# Patient Record
Sex: Male | Born: 1935 | Race: White
Health system: Northeastern US, Academic
[De-identification: ages and names within clinical notes are randomized; demographics above are authoritative.]

## PROBLEM LIST (undated history)

## (undated) DIAGNOSIS — I10 Essential (primary) hypertension: Secondary | ICD-10-CM

## (undated) DIAGNOSIS — E119 Type 2 diabetes mellitus without complications: Secondary | ICD-10-CM

## (undated) HISTORY — DX: Essential (primary) hypertension: I10

## (undated) HISTORY — DX: Type 2 diabetes mellitus without complications: E11.9

## (undated) MED FILL — Irinotecan HCl Inj 100 MG/5ML (20 MG/ML): INTRAVENOUS | Qty: 20.3 | Status: AC

---

## 2019-09-10 ENCOUNTER — Encounter: Payer: Self-pay | Admitting: Gastroenterology

## 2019-09-10 DIAGNOSIS — C187 Malignant neoplasm of sigmoid colon: Secondary | ICD-10-CM

## 2019-09-10 HISTORY — PX: TOTAL COLECTOMY: SHX852

## 2019-09-10 HISTORY — DX: Malignant neoplasm of sigmoid colon: C18.7

## 2019-09-12 ENCOUNTER — Encounter: Payer: Self-pay | Admitting: Gastroenterology

## 2019-10-12 ENCOUNTER — Encounter: Payer: Self-pay | Admitting: Gastroenterology

## 2019-10-19 ENCOUNTER — Encounter: Payer: Self-pay | Admitting: Gastroenterology

## 2019-11-16 ENCOUNTER — Other Ambulatory Visit: Payer: Self-pay | Admitting: Gastroenterology

## 2019-11-16 DIAGNOSIS — I2699 Other pulmonary embolism without acute cor pulmonale: Secondary | ICD-10-CM

## 2019-11-16 HISTORY — DX: Other pulmonary embolism without acute cor pulmonale: I26.99

## 2019-11-22 ENCOUNTER — Other Ambulatory Visit: Payer: Self-pay | Admitting: Gastroenterology

## 2019-11-22 IMAGING — PT PET CT SCAN TUMOR IMAGING SKULL TO THIGH  ([facility])
3 series · 25 of 25 positions shown · IV contrast (agent unspecified)
Comparison: CT abdomen pelvis September 10, 2019 outside facility

PET CT SCAN TUMOR IMAGING SKULL TO THIGH  ([facility]), CT CHEST/ABDOMEN AND PELVIS 
WITH CONTRAST  ([facility]), 11/22/2019 [DATE]: 
(Films were taken at [facility] on   11/22/2019  and interpreted 
at [facility] on 11/22/2019. 
CLINICAL INDICATION:  Malignant neoplasm sigmoid colon
TECHNIQUE: A dose of 14.3 millicuries of 18-FDG was administered intravenously 
and skull to thigh PET scanning was performed at 55 minutes. Tomographic scans 
were reconstructed in axial, coronal, and sagittal projections. The data was 
reconstructed into a three-dimensional volume rendered image and reviewed in a 
rotational cine loop. Serum blood glucose at the time of injection was 118 
mg/dl.

[Series 3: pet ac · axial · 4.0mm · 4.07mm/px · z∈[-878,-2]mm · 9 of 220 slices shown]
[im 1/220]
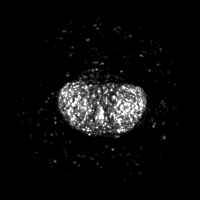
[im 28/220]
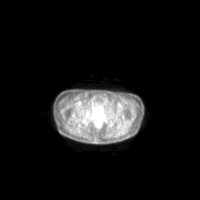
[im 55/220]
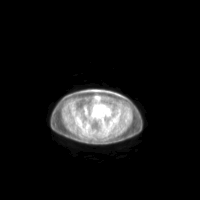
[im 83/220]
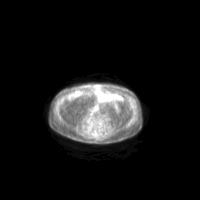
[im 110/220]
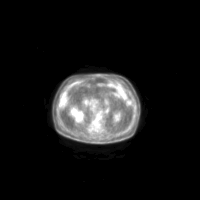
[im 137/220]
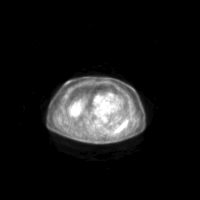
[im 165/220]
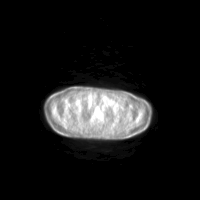
[im 192/220]
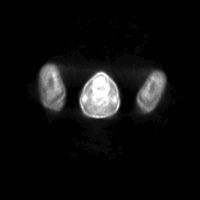
[im 220/220]
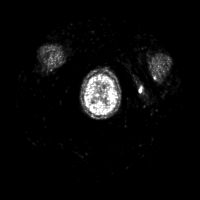

[Series 4: ct wb · axial · 4.0mm · 0.98mm/px · z∈[-878,-2]mm · 8 of 220 slices shown]
[im 1/220]
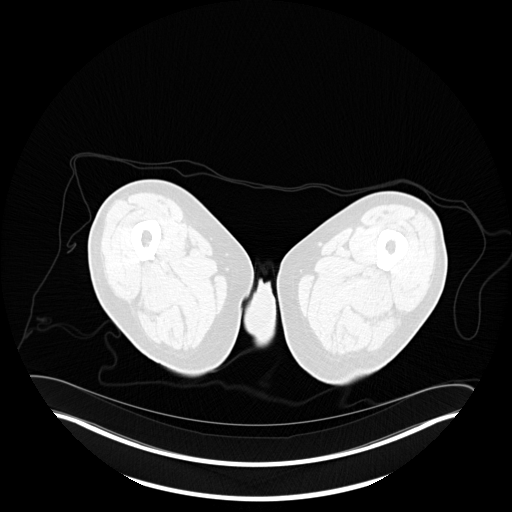
[im 32/220]
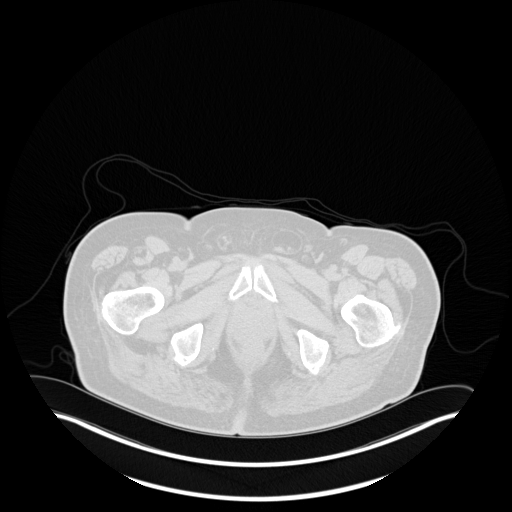
[im 63/220]
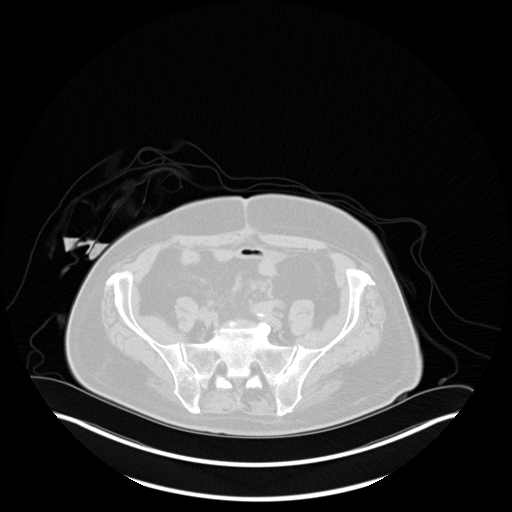
[im 94/220]
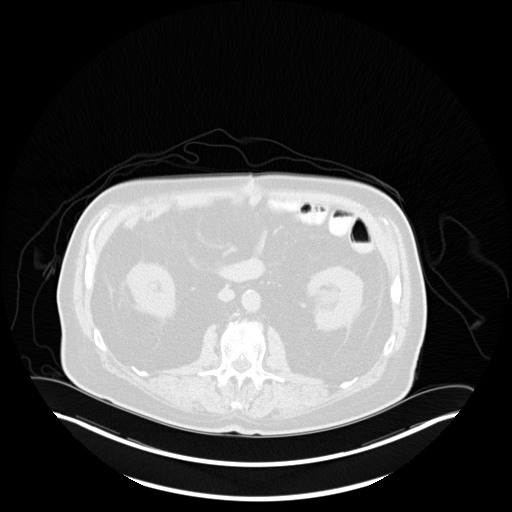
[im 126/220]
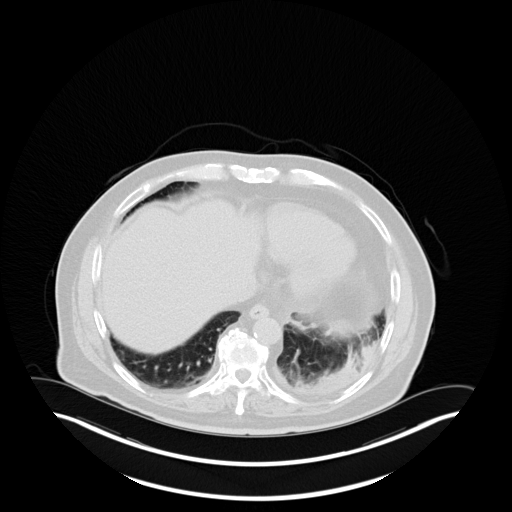
[im 157/220]
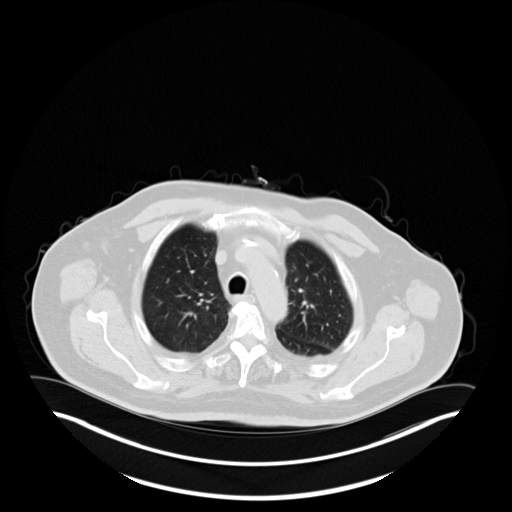
[im 188/220  brain]
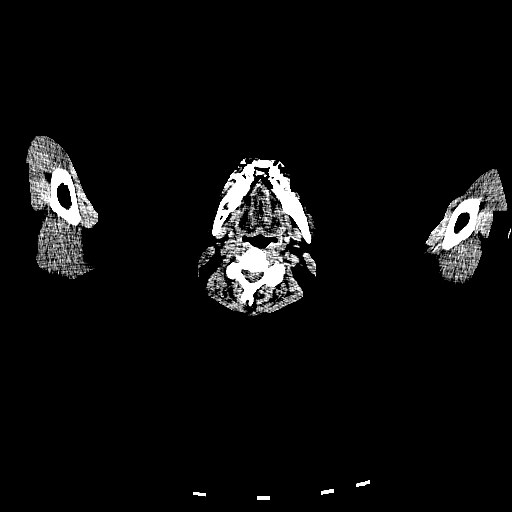
[im 220/220  brain]
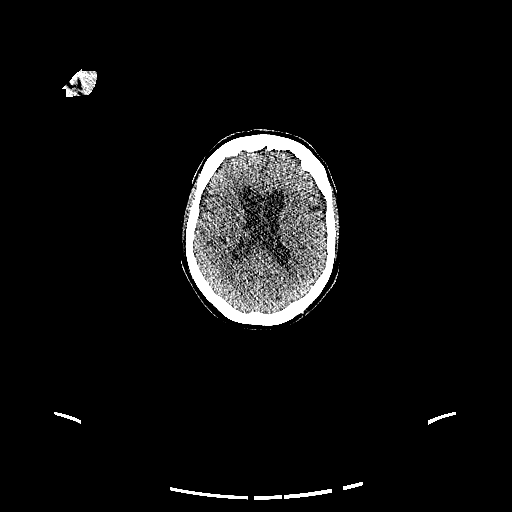

[Series 5: pet wb nac · axial · 4.0mm · 4.07mm/px · z∈[-878,-2]mm · 8 of 220 slices shown]
[im 1/220]
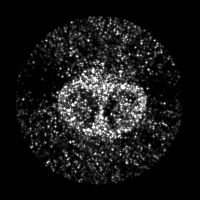
[im 32/220]
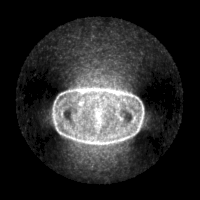
[im 63/220]
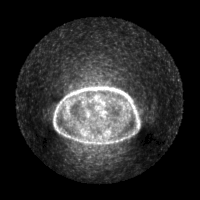
[im 94/220]
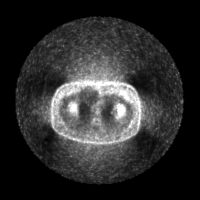
[im 126/220]
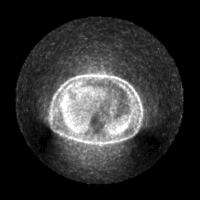
[im 157/220]
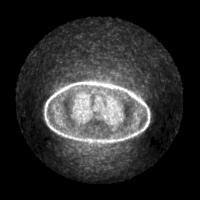
[im 188/220]
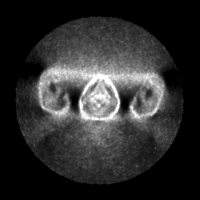
[im 220/220]
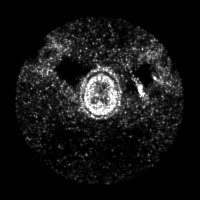

[25 of 25 positions shown; findings below may reference images not displayed]

FINDINGS: There is no abnormal FDG activity in cervical, supraclavicular or 
axillary node bearing regions. There is mild activity in the upper right hilum 
without evidence for adenopathy or mass. Maximum SUV 1.8. This is nonspecific 
but likely benign. Activity in the left hilum, maximum SUV 3.4 corresponding to 
CT image 76, also without evident adenopathy or mass, indeterminant. There is 
atelectasis at the left base showing maximum SUV 4.7, likely resolving 
postinflammatory change. No FDG avid intraperitoneal or retroperitoneal 
adenopathy. The liver and spleen are unremarkable. The patient is status post 
partial sigmoid resection with right-sided pelvic colostomy. No abnormal FDG 
activity corresponding to the colon or colostomy site. There is physiologic 
excretion of radiopharmaceutical by the urinary system. Prostate is enlarged but 
does not show FDG uptake. There is no abnormal osseous activity. 
CT of the chest shows no evidence for developing nodule or effusion. There are 
coronary artery calcifications. There is a left-sided chest port. The heart is 
not enlarged. 
CT of the abdomen and pelvis shows no adenopathy. There has been distal sigmoid 
resection and right colectomy. Colostomy site is unremarkable. Prostate is 
enlarged, mildly elevating the bladder. No visibly enlarged lymph nodes. Small 
lymph nodes in the midline pelvis are seen on axial images 136-146, the largest 
measuring 5 mm in short axis and not showing FDG activity. There is a benign 
right renal cyst. There are gallstones. No adrenal nodule. 
There is mild aortic plaque. There are degenerative changes. There is a 
fat-containing left inguinal hernia.
IMPRESSION: No evidence for recurrent or new neoplasm. Patient is status post right 
colostomy and sigmoid resection. 
Mild activity corresponding to atelectasis at the left lung base, most likely 
postinflammatory sequelae. 
Mild activity in the pulmonary hila as described, without evidence for mass or 
adenopathy. This is indeterminant but likely benign. Further surveillance 
follow-up advised. 
Cholelithiasis. 
Small fat-containing left inguinal hernia.

## 2019-11-22 IMAGING — CT CT CHEST/ABDOMEN AND PELVIS WITH CONTRAST  ([facility])
1 of 4 series · 12 of 32 positions shown, 18 images · IV contrast (agent unspecified)
Comparison: CT abdomen pelvis September 10, 2019 outside facility

PET CT SCAN TUMOR IMAGING SKULL TO THIGH  ([facility]), CT CHEST/ABDOMEN AND PELVIS 
WITH CONTRAST  ([facility]), 11/22/2019 [DATE]: 
(Films were taken at [facility] on   11/22/2019  and interpreted 
at [facility] on 11/22/2019. 
CLINICAL INDICATION:  Malignant neoplasm sigmoid colon
TECHNIQUE: A dose of 14.3 millicuries of 18-FDG was administered intravenously 
and skull to thigh PET scanning was performed at 55 minutes. Tomographic scans 
were reconstructed in axial, coronal, and sagittal projections. The data was 
reconstructed into a three-dimensional volume rendered image and reviewed in a 
rotational cine loop. Serum blood glucose at the time of injection was 118 
mg/dl.

[Series 2: cap w · axial · 0.98mm/px · z∈[-781,-220]mm · 12 of 221 slices shown, 18 images]
[im 17/221  soft-tissue]
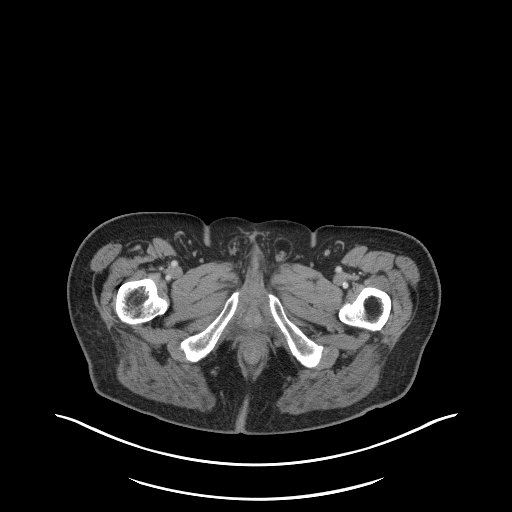
[im 17/221  bone]
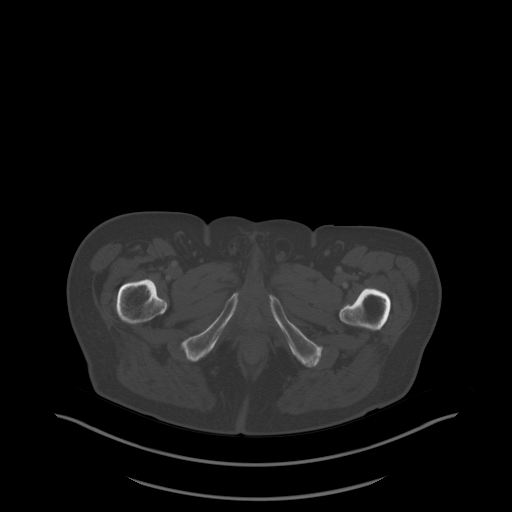
[im 34/221  soft-tissue]
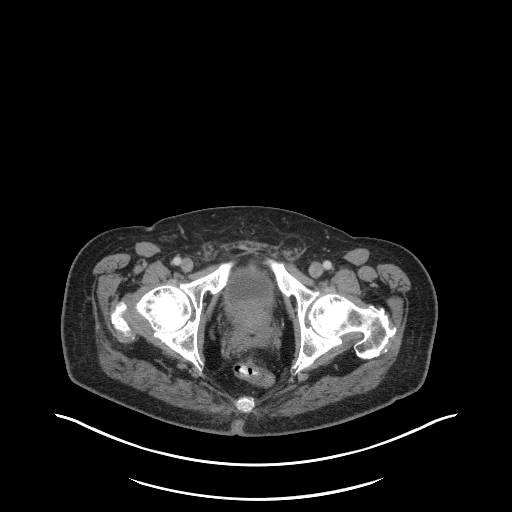
[im 51/221  soft-tissue]
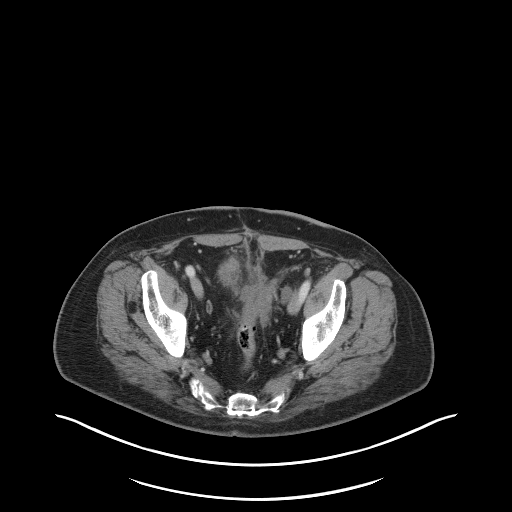
[im 68/221  soft-tissue]
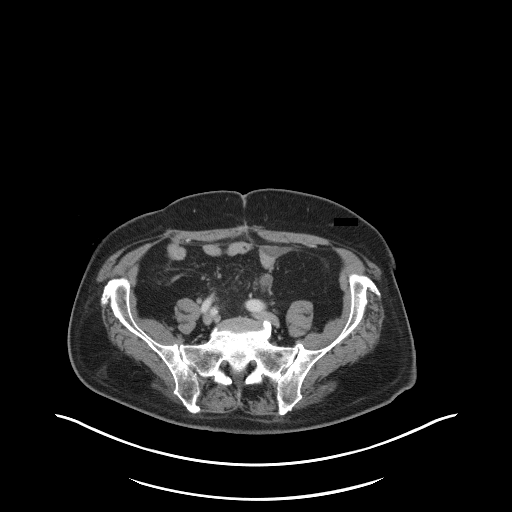
[im 85/221  soft-tissue]
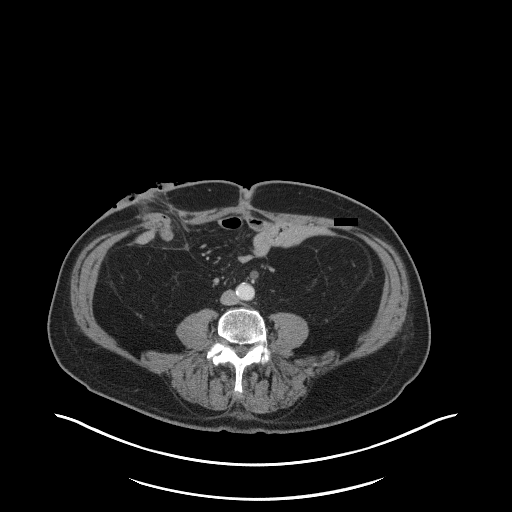
[im 102/221  soft-tissue]
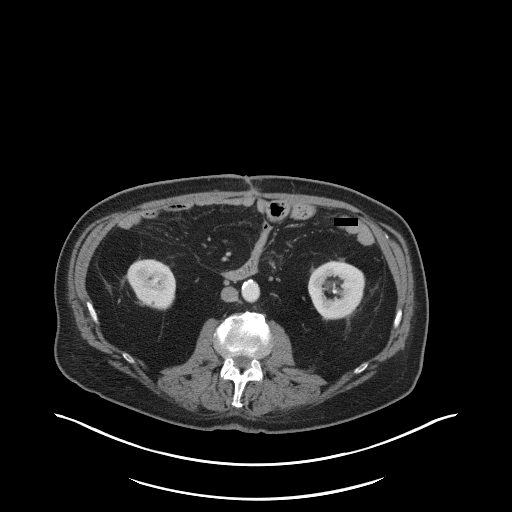
[im 119/221  soft-tissue]
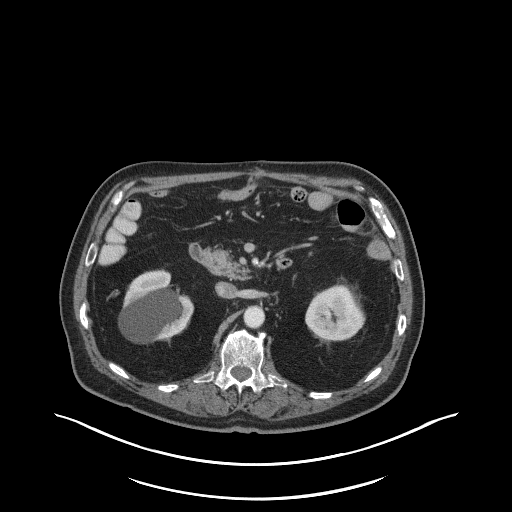
[im 136/221  soft-tissue]
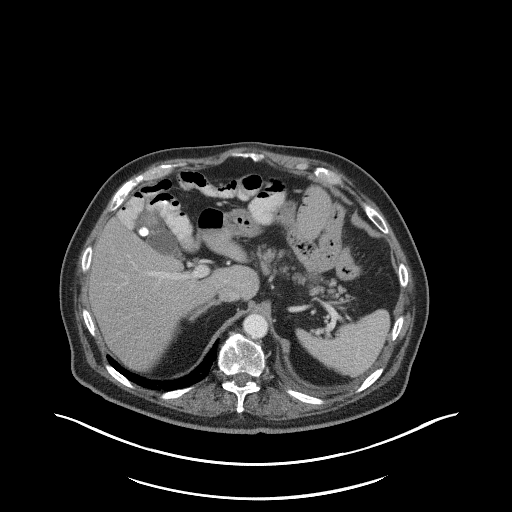
[im 153/221  soft-tissue]
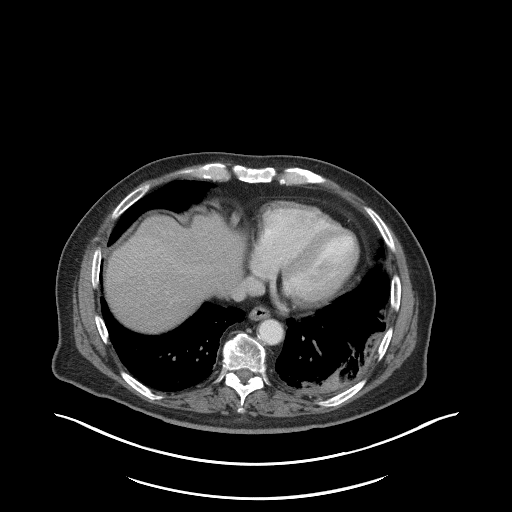
[im 153/221  lung]
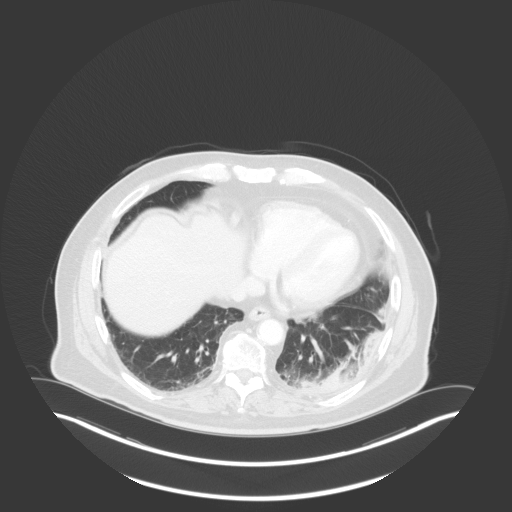
[im 153/221  bone]
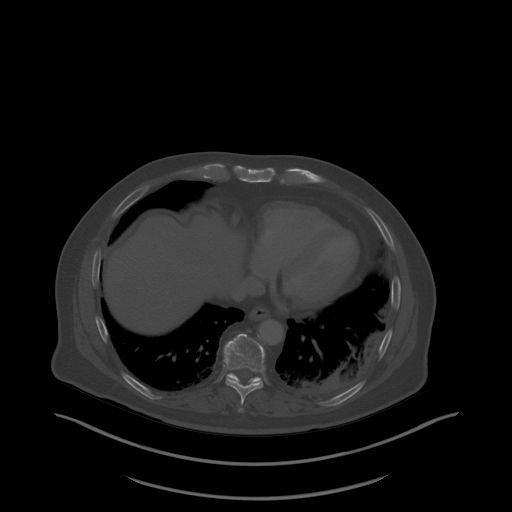
[im 170/221  soft-tissue]
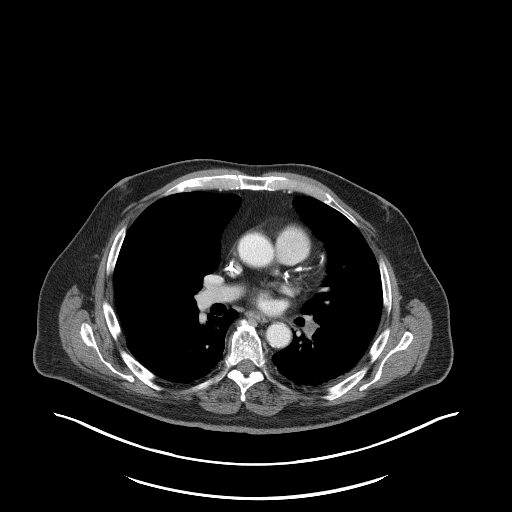
[im 170/221  lung]
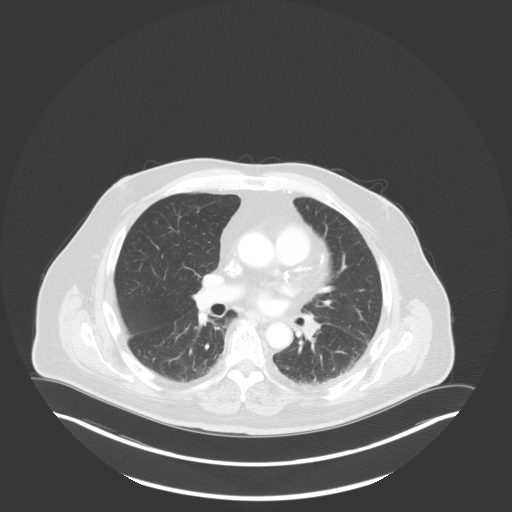
[im 187/221  soft-tissue]
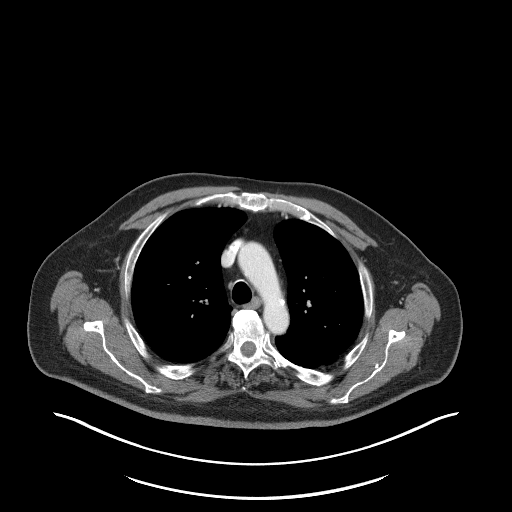
[im 187/221  lung]
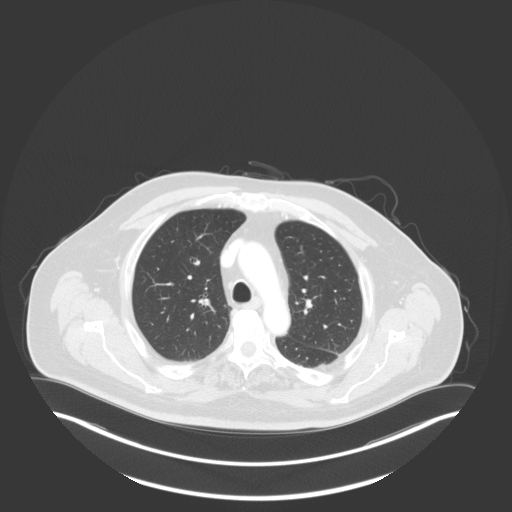
[im 204/221  soft-tissue]
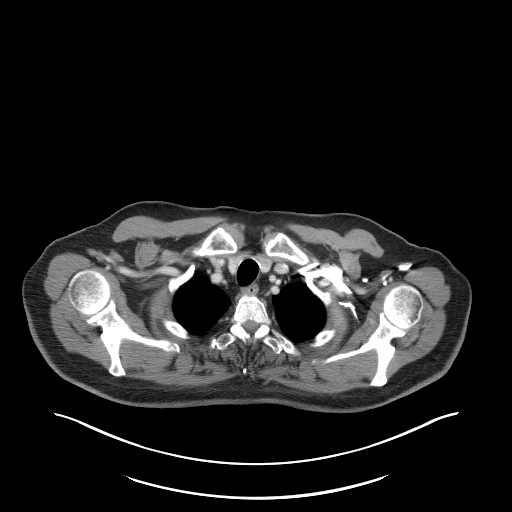
[im 204/221  lung]
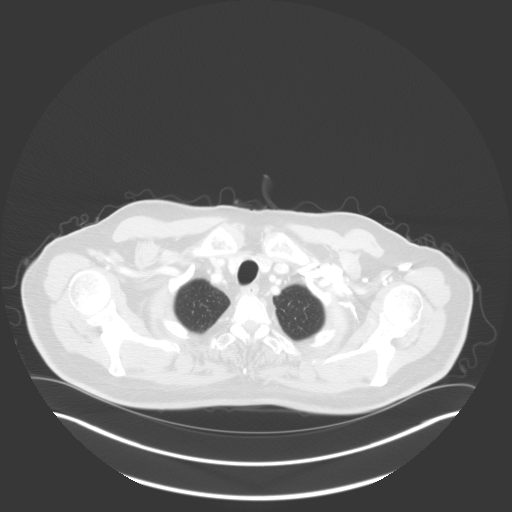

[12 of 32 positions shown; findings below may reference images not displayed]

FINDINGS: There is no abnormal FDG activity in cervical, supraclavicular or 
axillary node bearing regions. There is mild activity in the upper right hilum 
without evidence for adenopathy or mass. Maximum SUV 1.8. This is nonspecific 
but likely benign. Activity in the left hilum, maximum SUV 3.4 corresponding to 
CT image 76, also without evident adenopathy or mass, indeterminant. There is 
atelectasis at the left base showing maximum SUV 4.7, likely resolving 
postinflammatory change. No FDG avid intraperitoneal or retroperitoneal 
adenopathy. The liver and spleen are unremarkable. The patient is status post 
partial sigmoid resection with right-sided pelvic colostomy. No abnormal FDG 
activity corresponding to the colon or colostomy site. There is physiologic 
excretion of radiopharmaceutical by the urinary system. Prostate is enlarged but 
does not show FDG uptake. There is no abnormal osseous activity. 
CT of the chest shows no evidence for developing nodule or effusion. There are 
coronary artery calcifications. There is a left-sided chest port. The heart is 
not enlarged. 
CT of the abdomen and pelvis shows no adenopathy. There has been distal sigmoid 
resection and right colectomy. Colostomy site is unremarkable. Prostate is 
enlarged, mildly elevating the bladder. No visibly enlarged lymph nodes. Small 
lymph nodes in the midline pelvis are seen on axial images 136-146, the largest 
measuring 5 mm in short axis and not showing FDG activity. There is a benign 
right renal cyst. There are gallstones. No adrenal nodule. 
There is mild aortic plaque. There are degenerative changes. There is a 
fat-containing left inguinal hernia.
IMPRESSION: No evidence for recurrent or new neoplasm. Patient is status post right 
colostomy and sigmoid resection. 
Mild activity corresponding to atelectasis at the left lung base, most likely 
postinflammatory sequelae. 
Mild activity in the pulmonary hila as described, without evidence for mass or 
adenopathy. This is indeterminant but likely benign. Further surveillance 
follow-up advised. 
Cholelithiasis. 
Small fat-containing left inguinal hernia.

## 2019-12-19 ENCOUNTER — Encounter: Payer: Self-pay | Admitting: Gastroenterology

## 2020-01-09 ENCOUNTER — Encounter: Payer: Self-pay | Admitting: Gastroenterology

## 2020-01-17 ENCOUNTER — Encounter: Payer: Self-pay | Admitting: Gastroenterology

## 2020-02-02 ENCOUNTER — Other Ambulatory Visit: Payer: Self-pay | Admitting: Gastroenterology

## 2020-02-02 IMAGING — CT CT CHEST/ABDOMEN AND PELVIS WITH CONTRAST  ([facility])
1 of 4 series · 12 of 32 positions shown, 18 images · IV contrast (isovue)
Comparison: Comparison was made to the prior exam(s) within the last 12 months 
dated  12/22/2018 CT and PET/CT

CT CHEST/ABDOMEN AND PELVIS WITH CONTRAST  ([facility]), 02/02/2020 [DATE]: 
(Films were taken at [facility].) 
CLINICAL INDICATION:  Malignant neoplasm sigmoid colon. Patient is receiving 
chemotherapy. 
A search for DICOM formatted images was conducted for prior CT imaging studies 
completed at a non-affiliated media free facility.
TECHNIQUE: The chest, abdomen and pelvis were scanned with 100 ml of 
intravenous Isovue 300. Routine MPR reconstructions were performed.

[Series 2: cap w · axial · 0.98mm/px · z∈[-500,+130]mm · 12 of 246 slices shown, 18 images]
[im 18/246  soft-tissue]
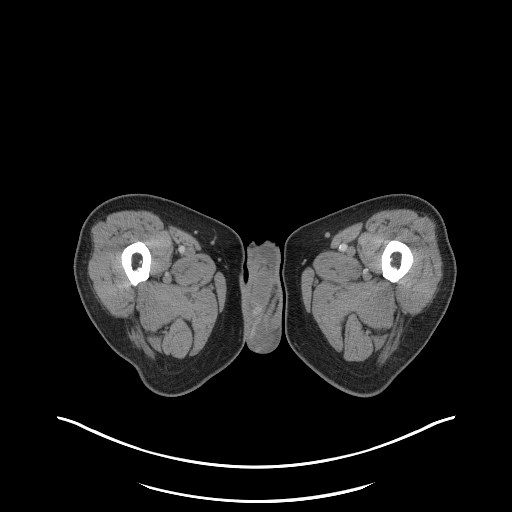
[im 18/246  bone]
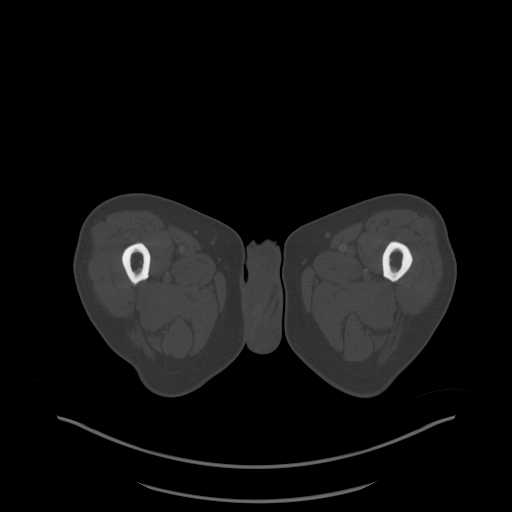
[im 36/246  soft-tissue]
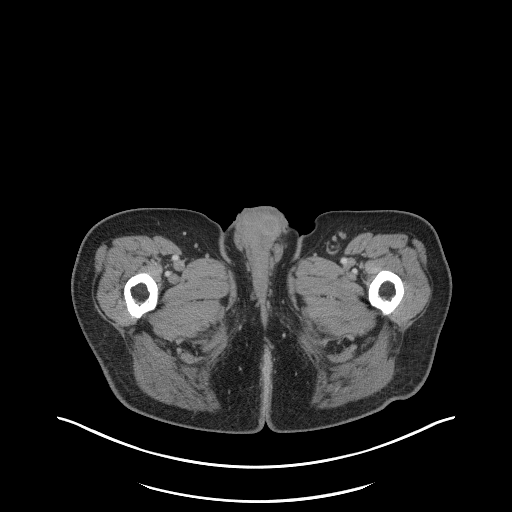
[im 53/246  soft-tissue]
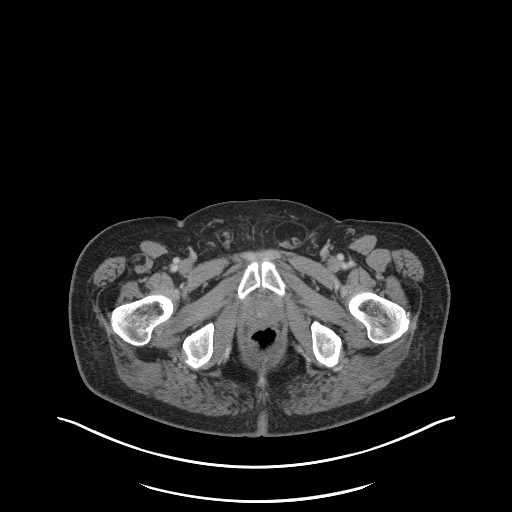
[im 71/246  soft-tissue]
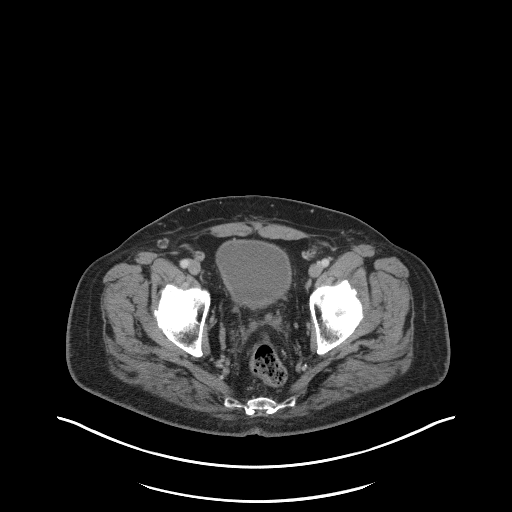
[im 88/246  soft-tissue]
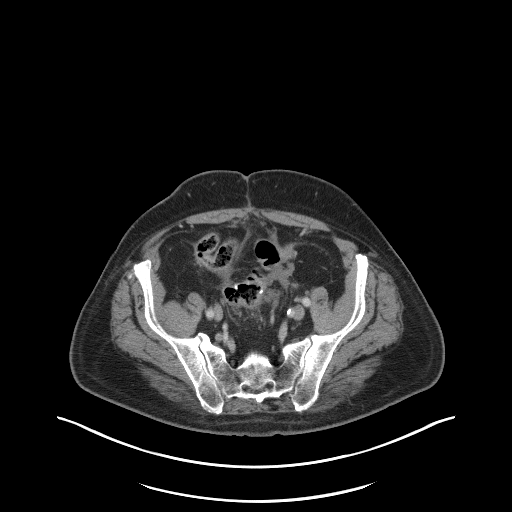
[im 106/246  soft-tissue]
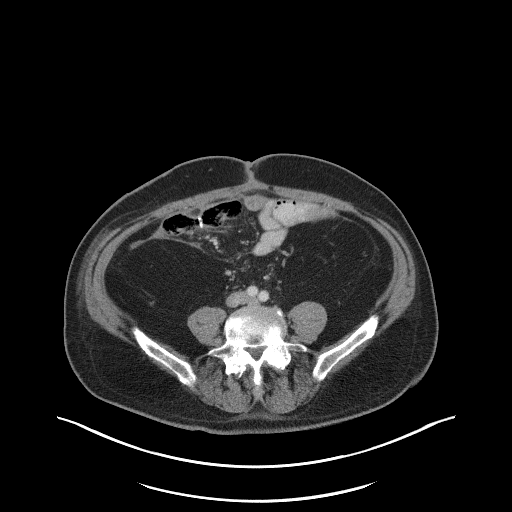
[im 141/246  soft-tissue]
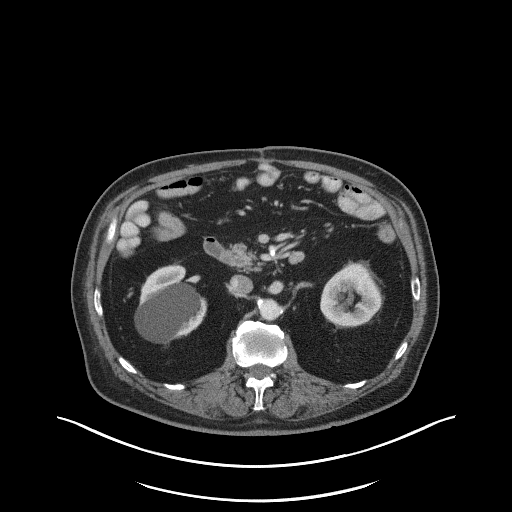
[im 158/246  soft-tissue]
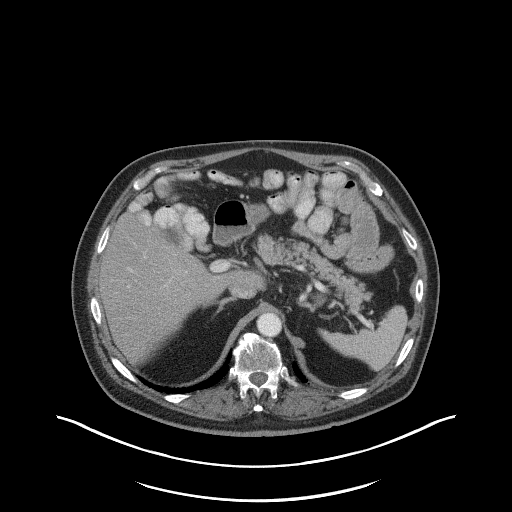
[im 176/246  soft-tissue]
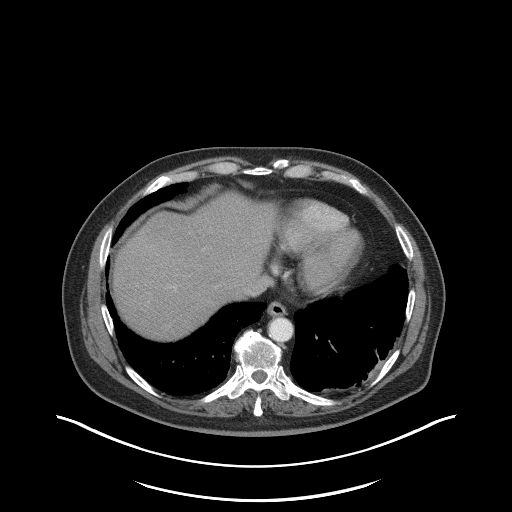
[im 176/246  lung]
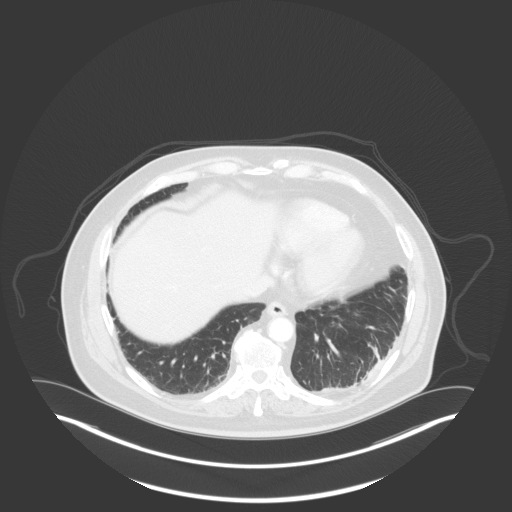
[im 176/246  bone]
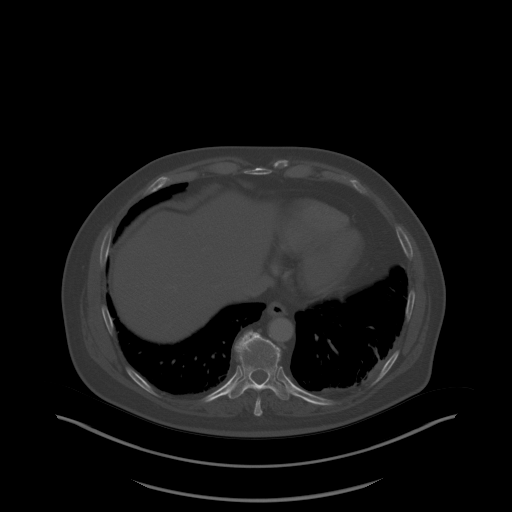
[im 193/246  soft-tissue]
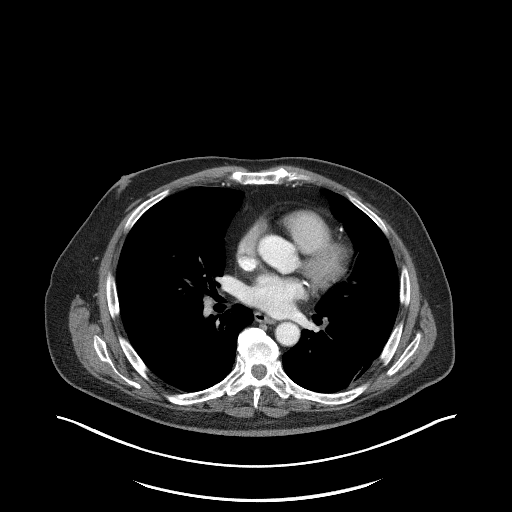
[im 193/246  lung]
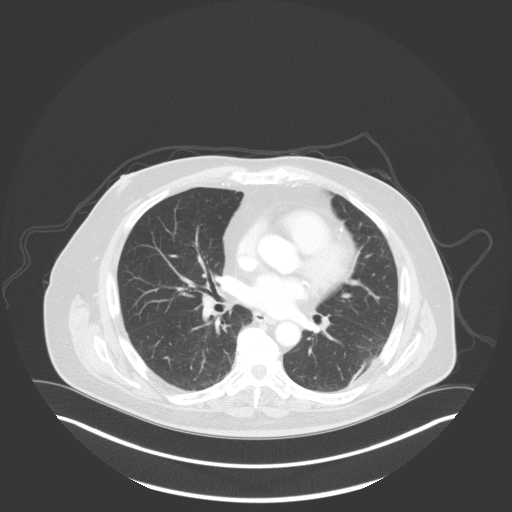
[im 211/246  soft-tissue]
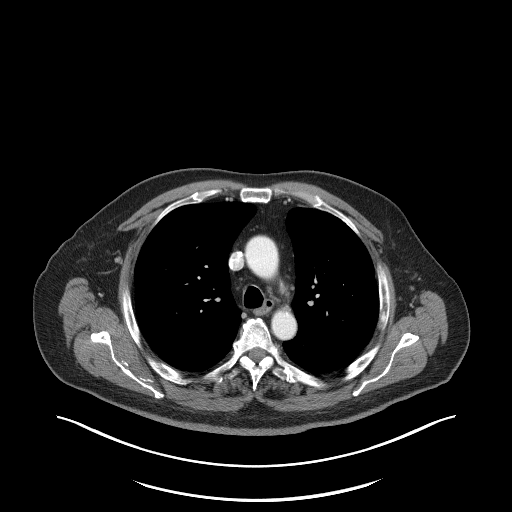
[im 211/246  lung]
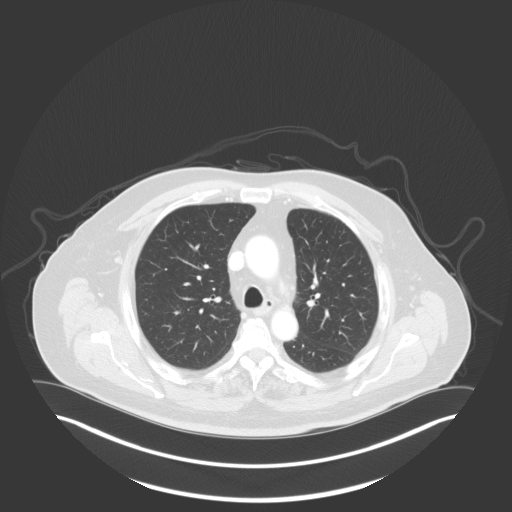
[im 228/246  soft-tissue]
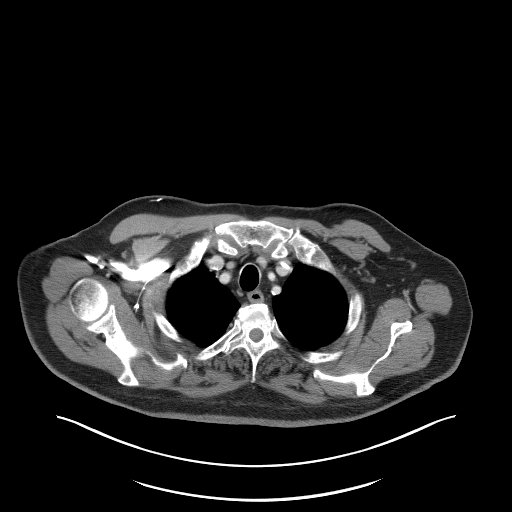
[im 228/246  lung]
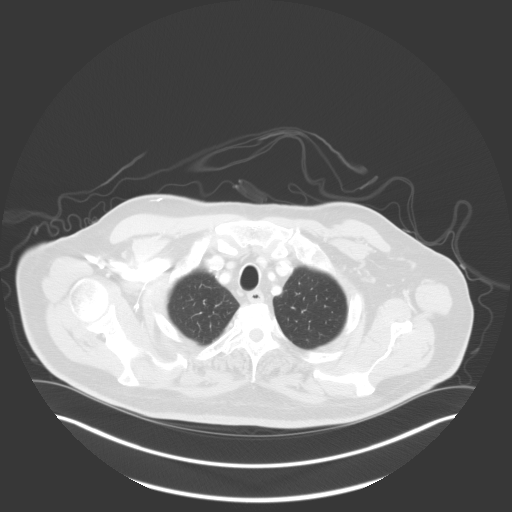

[12 of 32 positions shown; findings below may reference images not displayed]

FINDINGS: LUNGS/PLEURA: LLL atelectasis and mild pleural thickening. LLL calcified 
granuloma.. No pleural effusions. No pneumothorax. 
HEART: Heart is normal in size and configuration. Coronary arterial 
calcifications. 
MEDIASTINUM: No mass. 
AORTA/VESSELS: Atherosclerosis. No aneurysm. Left subclavian Port-A-Cath. 
PULMONARY ARTERIES: No pulmonary filling defects. Normal in caliber. 
LIVER/BILE DUCTS: No mass or biliary dilatation. 
GALLBLADDER: Cholelithiasis. No gallbladder wall thickening. 
SPLEEN: Normal. 
PANCREAS: Normal. 
ADRENAL GLANDS: Normal. 
KIDNEYS: Renal cysts. No mass, hydronephrosis or calculi. 
STOMACH/BOWEL: Postsurgical change status post partial colectomy and reversal of 
the colostomy. No bowel obstruction. 
LYMPH NODES/ MESENTERY/PERITONEAL SPACE: Multifocal mesenteric fat stranding and 
ill-defined soft tissue about the sigmoid anastomosis (sequence 2 image 165). 
Improvement in the left central pelvic mesenteric nodules measuring up to 1.3 x 
1.1 cm (image 154, previously 1.4 x 1.0 cm) and 1.4 x 0.9 cm (image 158, 
previously 1.8 x 1.1 cm). Stable 2.6 x 1.2 cm left external iliac adenopathy 
(sequence 2 image 164). New mesenteric fat stranding about the right abdominal 
bowel resection staples adjacent to the reversed colostomy. 
PELVIC ORGANS: Normal. 
MUSCULOSKELETAL: Degenerative change. No lytic or blastic lesions. Gynecomastia.
IMPRESSION: 1. Postsurgical change status post partial colectomy and reversal of the 
colostomy. 
2. Multifocal mesenteric fat stranding and ill-defined soft tissue about the 
sigmoid anastomosis, and improved left central pelvic mesenteric nodules. 
3. Stable 2.6 x 1.2 cm left external iliac lymphadenopathy. 
4. Cholelithiasis. 
5. Renal cysts. 
6. Atherosclerosis and coronary arterial calcifications. 
7. Left Port-A-Cath. 
RADIATION DOSE REDUCTION: All CT scans are performed using radiation dose 
reduction techniques, when applicable. Technical factors are evaluated and 
adjusted to ensure appropriate moderation of exposure. Automated dose management 
technology is applied to adjust the radiation doses to minimize exposure while 
achieving diagnostic quality images.

## 2020-04-02 ENCOUNTER — Encounter: Payer: Self-pay | Admitting: Gastroenterology

## 2020-04-05 ENCOUNTER — Other Ambulatory Visit: Payer: Self-pay | Admitting: Gastroenterology

## 2020-04-26 ENCOUNTER — Other Ambulatory Visit: Payer: Self-pay | Admitting: Gastroenterology

## 2020-04-26 IMAGING — CT CT CHEST/ABDOMEN AND PELVIS WITH CONTRAST  ([facility])
1 of 4 series · 12 of 32 positions shown, 17 images · IV contrast (isovue)
Comparison: Comparison was made to the prior exam(s) within the last 12 months 
dated  02/02/2020

CT CHEST/ABDOMEN AND PELVIS WITH CONTRAST  ([facility]), 04/26/2020 [DATE]: 
(Films were taken at [facility].) 
CLINICAL INDICATION:  Malignant neoplasm sigmoid colon. Patient is receiving 
chemotherapy and radiation therapy. 
A search for DICOM formatted images was conducted for prior CT imaging studies 
completed at a non-affiliated media free facility.
TECHNIQUE: The chest, abdomen and pelvis were scanned with 100 ml of 
intravenous Isovue 300. Routine MPR reconstructions were performed.

[Series 2: cap w · axial · 0.93mm/px · z∈[-644,-34]mm · 12 of 237 slices shown, 17 images]
[im 17/237  soft-tissue]
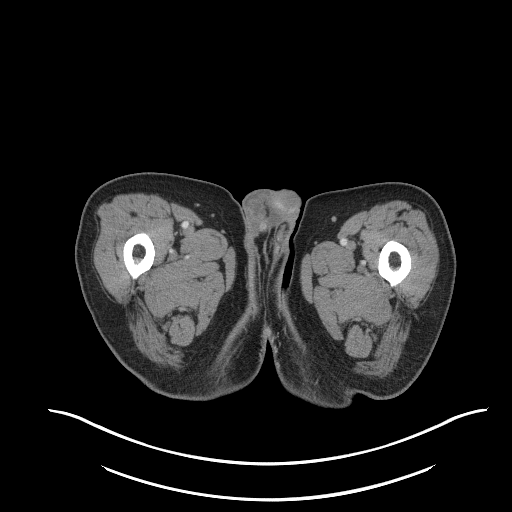
[im 17/237  bone]
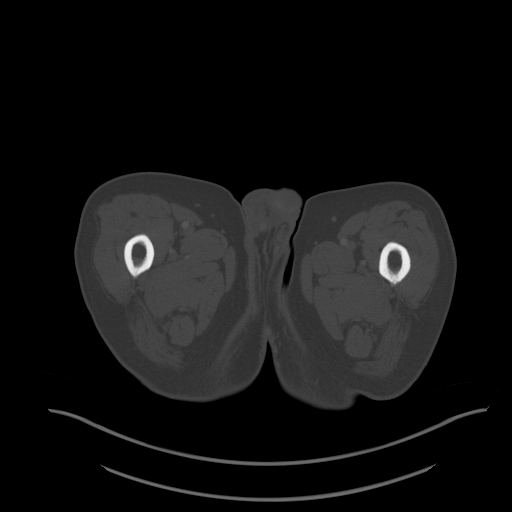
[im 34/237  soft-tissue]
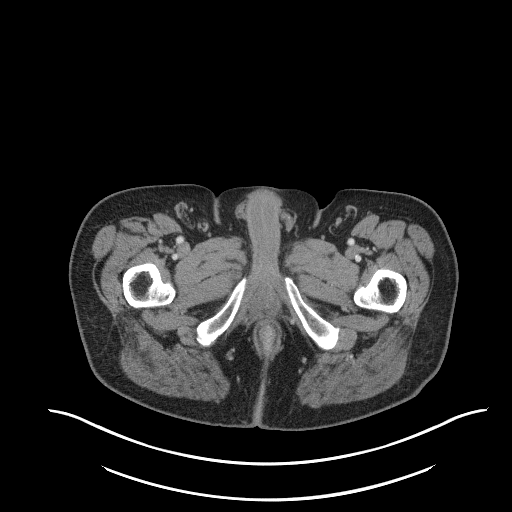
[im 51/237  soft-tissue]
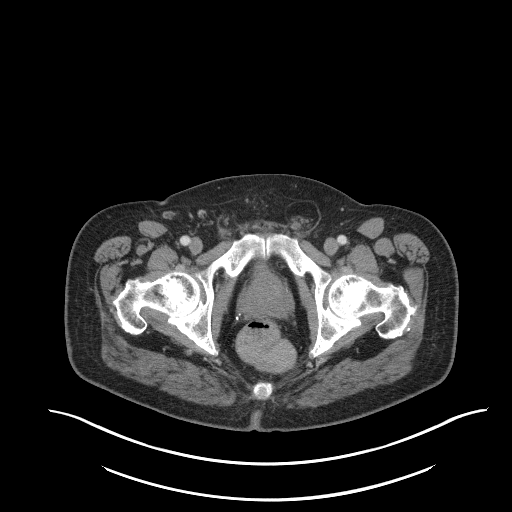
[im 85/237  soft-tissue]
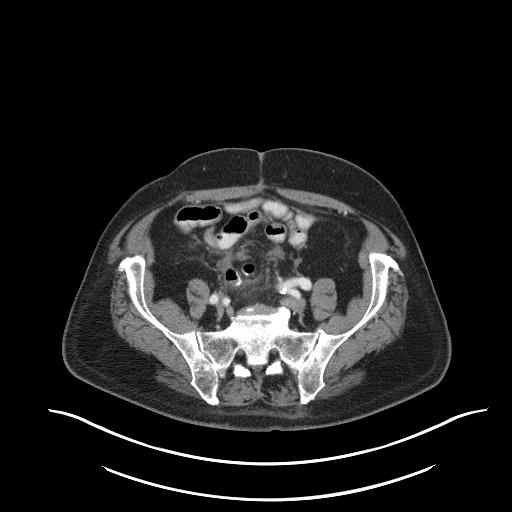
[im 102/237  soft-tissue]
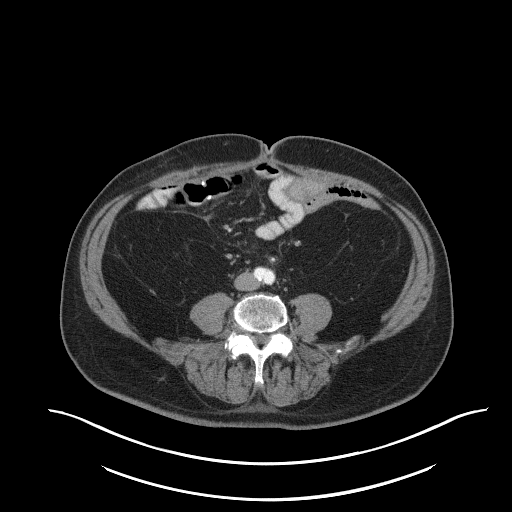
[im 119/237  soft-tissue]
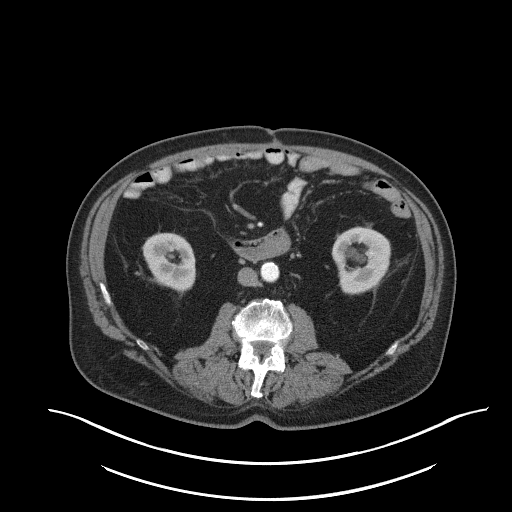
[im 135/237  soft-tissue]
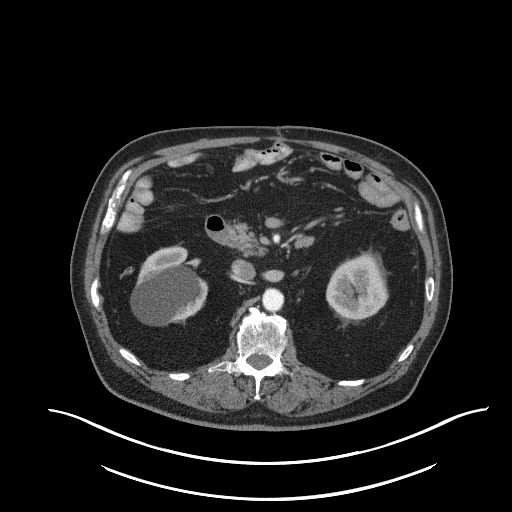
[im 152/237  soft-tissue]
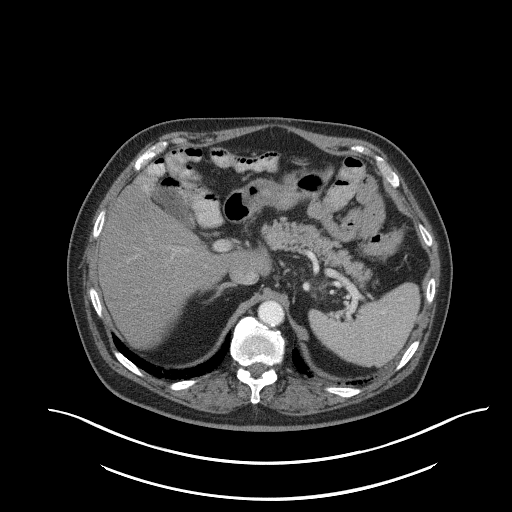
[im 169/237  lung]
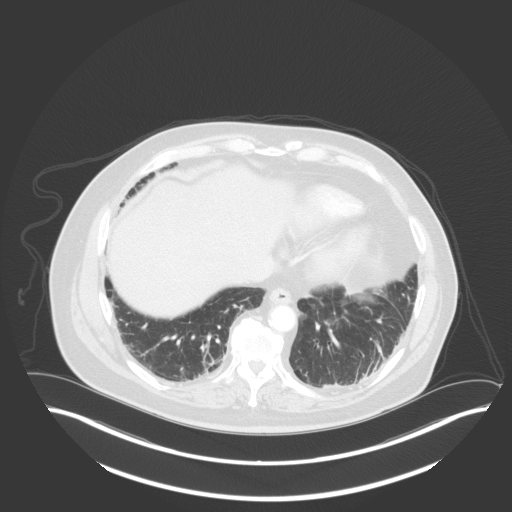
[im 186/237  soft-tissue]
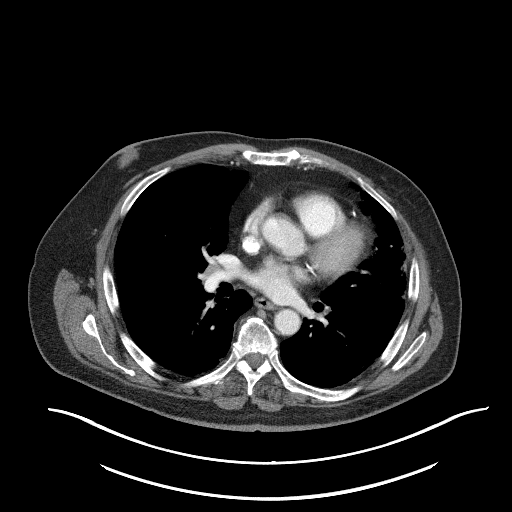
[im 186/237  lung]
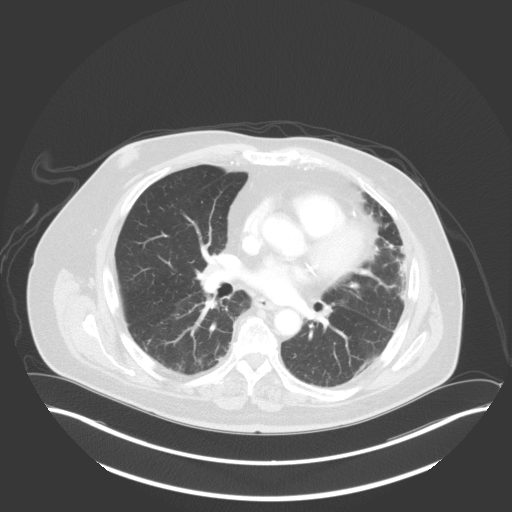
[im 186/237  bone]
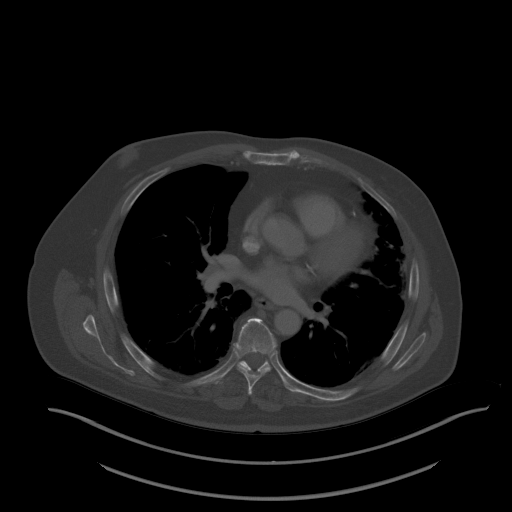
[im 203/237  soft-tissue]
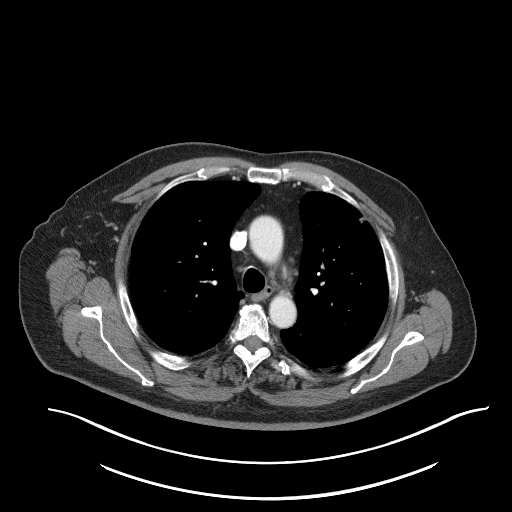
[im 203/237  lung]
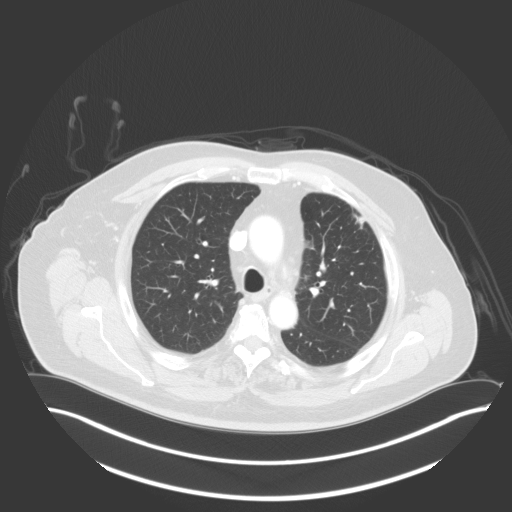
[im 220/237  soft-tissue]
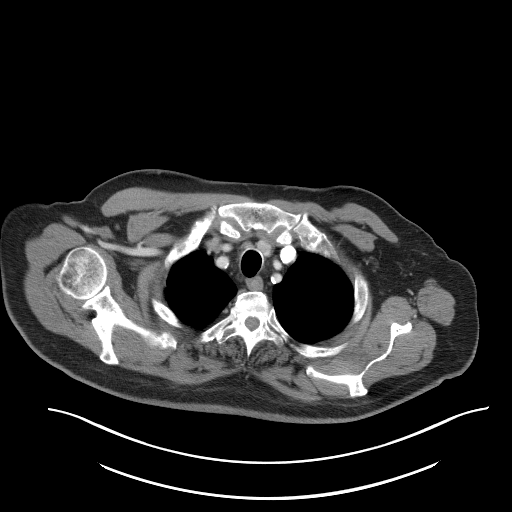
[im 220/237  lung]
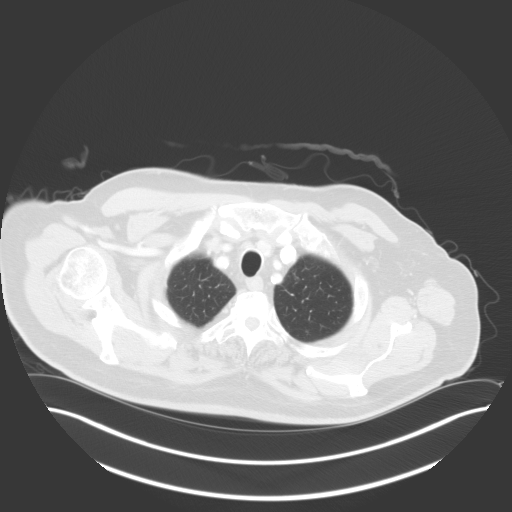

[12 of 32 positions shown; findings below may reference images not displayed]

FINDINGS: LUNGS/PLEURA: New multifocal peripheral volume loss and fissural nodularity. 
Able mild pleural thickening. LLL calcified granuloma. No pleural effusions. No 
pneumothorax. 
HEART: Heart is normal in size and configuration. Coronary arterial 
calcifications. 
MEDIASTINUM: No mass. 
AORTA/VESSELS: Atherosclerosis. No aneurysm. Left subclavian Port-A-Cath. 
PULMONARY ARTERIES: No pulmonary filling defects. Normal in caliber. 
LIVER/BILE DUCTS: Fatty infiltration of the liver. No mass or biliary 
dilatation. 
GALLBLADDER: Cholelithiasis. No gallbladder wall thickening. 
SPLEEN: Normal. 
PANCREAS: Normal. 
ADRENAL GLANDS: Normal. 
KIDNEYS: Renal cysts. No mass, hydronephrosis or calculi. 
STOMACH/BOWEL: Postsurgical change status post partial colectomy and colostomy 
reversal. No bowel obstruction. 
LYMPH NODES/ MESENTERY/PERITONEAL SPACE: Multifocal mesenteric fat stranding and 
soft tissue mass about the sigmoid anastomosis are more prominent and defined: 
3.0 x 2.8 cm focus extends to the left posterior bladder (sequence 2 image 165) 
and 4.4 x 2.4 cm soft tissue focus extends more superiorly (image 162). Left and 
central pelvic mesenteric nodules, with the largest measuring 1.4 x 1.2 cm 
(image 152, previously 1.3 x 1.1 cm) and 1.7 x 1.1 cm (image 156, previously
x 0.9 cm). Development of 2.4 x 1.0 cm focus of nodularity to the left of the 
bladder extends from the sigmoid colon to the anterior abdominal wall (image 
162). Stable 2.5 x 1.2 cm left external iliac adenopathy (sequence 2 image 162). 
PELVIC ORGANS: Mild prostatomegaly (4.9 x 4.0 cm in diameter). 
MUSCULOSKELETAL: Degenerative change. No lytic or blastic lesions. Small left 
inguinal hernia contains fat. 1.4 x 1.1 cm right gluteus minimus fatty nodule, 
consistent with lipoma. Gynecomastia.
IMPRESSION: 1. Postsurgical change status post partial colectomy and ostomy reversal. 
2. Multifocal mesenteric fat stranding and soft tissue mass about the sigmoid 
anastomosis are more prominent and defined consistent with malignancy. 
3. Left and central pelvic mesenteric nodules have slightly enlarged. 
4. Stable 2.5 x 1.2 cm left external iliac adenopathy. 
5. Stable additional findings. 
RADIATION DOSE REDUCTION: All CT scans are performed using radiation dose 
reduction techniques, when applicable. Technical factors are evaluated and 
adjusted to ensure appropriate moderation of exposure. Automated dose management 
technology is applied to adjust the radiation doses to minimize exposure while 
achieving diagnostic quality images.

## 2020-04-30 ENCOUNTER — Encounter: Payer: Self-pay | Admitting: Gastroenterology

## 2020-05-07 ENCOUNTER — Encounter: Payer: Self-pay | Admitting: Gastroenterology

## 2020-05-09 ENCOUNTER — Other Ambulatory Visit: Payer: Self-pay | Admitting: Gastroenterology

## 2020-05-09 IMAGING — PT PET CT SCAN TUMOR IMAGING SKULL TO THIGH  ([facility])
1 of 3 series · 1 of 25 positions shown · non-contrast
Comparison: CT scan 04/26/2020 and PET scan 11/22/2019

PET CT SCAN TUMOR IMAGING SKULL TO THIGH  ([facility]), 05/09/2020 [DATE]: 
(Films were taken at [facility] on 05/09/2020 and interpreted at 
[facility] on 05/09/2020. 
CLINICAL INDICATION:  Colon carcinoma
TECHNIQUE: A dose of 15.0 millicuries of 18-FDG was administered intravenously 
and skull to thigh PET scanning was performed at 51 minutes. Tomographic scans 
were reconstructed in axial, coronal, and sagittal projections. The data was 
reconstructed into a three-dimensional volume rendered image and reviewed in a 
rotational cine loop. Serum blood glucose at the time of injection was 90 mg/dl.

[Series 4: ct wb · axial · 4.0mm · 0.98mm/px · 1 of 242 slices shown]
[im 207/242  soft-tissue]
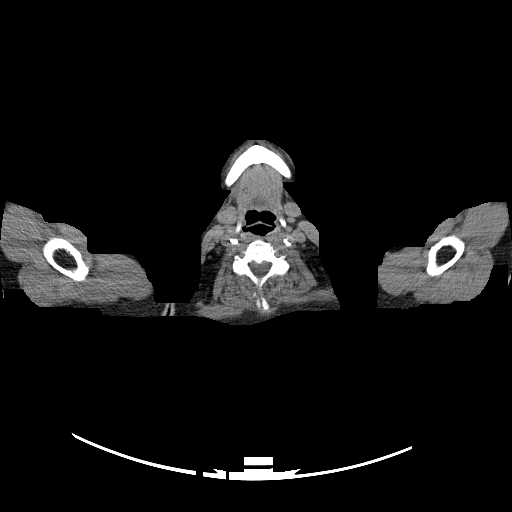

[1 of 25 positions shown; findings below may reference images not displayed]

FINDINGS: The low-dose CT shows a left-sided port. Atherosclerotic changes with 
marked coronary calcifications. Stable scarring or atelectasis in the right 
apex. There is a pleural-based area of consolidation within the lungs 
bilaterally. These have developed just since the exam of 04/26/2020 concerning for 
multifocal inflammatory or infectious process. Differential would include 
pneumonia, secondary changes of pulmonary embolic disease or more an 
inflammatory process such as cryptogenic organizing pneumonia. Not a typical 
neoplastic appearance especially given the fact that is developed within 13 
days. Bilateral gynecomastia. Cholelithiasis. The lymphadenopathy seen in the 
pelvis and soft tissue at the anastomosis are again identified. Better measured 
on the postcontrast CT scan of 04/26/2020. 
The PET images show asymmetric abnormal activity corresponding to the right 
mandible in the area of what appears to be a dental implant. Would recommend 
correlation with direct visualization in this region. Nonspecific with a maximum 
SUV of 6.3 not present previously. There is activity corresponding to the areas 
of pleural-based consolidation within the lungs most pronounced in the left 
upper lobe at 5.7 not present on the prior PET scan. Suspect physiologic 
activity overlying the hila and breast bilaterally in this patient with 
bilateral gynecomastia. There is low-grade activity corresponding to the low 
density lymph nodes in the pelvis maximum SUV of 2.8. There is activity 
overlying the anastomotic site maximum SUV of 3.9. No osseous lesion seen. No 
further focal abnormality identified.
IMPRESSION: Multiple abnormalities. There is activity seen at the site of anastomosis 
maximum SUV of 3.9 with low-grade activity in the adjacent lymph nodes maximum 
SUV 2.8. 
New consolidative changes in the subpleural location bilaterally most marked 
posteriorly left upper lobe. Not present 13 days ago consistent with 
inflammatory or infectious process. Please see discussion above. 
New activity involving the right mandibular teeth. Recommend correlation with 
direct visualization as this was not present on the prior PET scan from October 2019. 
Atherosclerotic changes and degenerative changes. Cholelithiasis.

## 2020-05-10 ENCOUNTER — Encounter: Payer: Self-pay | Admitting: Gastroenterology

## 2020-05-21 NOTE — Progress Notes (Signed)
PATIENT NAME: ARREN Woods (MRN: [id number])  DATE OF SERVICE: 05/22/2020          Dear Dr. Warner Mccreedy,     Your patient, Carlos Woods was seen in the office in consultation for further evaluation and management of COLON CANCER.    HISTORY OF PRESENT ILLNESS:  As you are aware, Carlos Woods is a 84 y.o. male with stage IIIC colon cancer who has been receiving adjuvant chemotherapy in [place] and is in the area until October and presents for further treatment and follow-up.  -History is summarized from documentation received from you.  -Patient apparently presented with SIGMOID COLON OBSTRUCTION and contained PERFORATION of the colon.  -CT ABDOMEN PELVIS on 09/10/2019 reportedly showed colonic diverticulosis with acute diverticulitis sigmoid colon, bowel obstruction, likely secondary to sigmoid colon mass.  -S/P TOTAL ABDOMINAL COLECTOMY with ileal proctostomy and DIVERTING LOOP ILEOSTOMY on 09/12/2019.  -Pathology from 09/10/2019 showed pT4 pN2b, grade 3, INVASIVE MUCINOUS AND SIGNET RING ADENOCARCINOMA arising in the sigmoid diverticulum with perforation.  Tumor invaded through the bowel wall into the pericolic fat.  Radial and distal MARGINS WERE POSITIVE for tumor.  29 of 30 lymph nodes were positive for metastatic mucinous adenocarcinoma with extracapsular invasion and vascular invasion.  Diffuse diverticulosis and bowel obstruction noted.  -Patient was seen by you on 10/12/2019 and was advised staging scans, guardant 360 and adjuvant treatment with FOLFOX.  -GUARDANT 360 on 10/12/2019 showed K-ras G 12V mutation, MSS, TMB 10.58 mut/MB and VUS CDKN2A, NF1.  -Patient was apparently started on adjuvant FOLFOX on 10/24/2019 and treatment was complicated by recurrent chest pain and shortness of breath, requiring hospitalization and cardiac catheterization after the 2nd cycle of FOLFOX on 10/31/2019.    -CTA CHEST on 11/16/2019, showed a large left-sided pulmonary embolism, and patient is  currently on Eliquis.  -PET/CT on 11/22/2019 with mild activity in the upper right hilum with SUV 1.8 and activity in the left hilum with SUV 3.4, without evidence of adenopathy or mass.  No evidence of recurrent or new neoplasm noted.  -Patient only recalls 1 episode of chest pain related to PE, but was apparently switched to Xeloda on 11/28/2019, after 2 cycles of FOLFOX and has received 12 cycles of chemotherapy with 2 cycles of FOLFOX and 10 cycles of CAPEOX, per phone call to [facility], infusion RN.  Chemotherapy flowsheets are awaited.  -Per your notes, it appears that he has been receiving oxaliplatin 85 mg every 2 weeks and last treatment was administered on 04/30/2020.  -CT CHEST ABDOMEN PELVIS on 04/26/2020 compared with 02/02/2020, showed new multifocal peripheral volume loss and fissural nodularity, multifocal mesenteric fat stranding and soft tissue mass about the sigmoid anastomosis 3 x 2.8 cm extending to the left posterior bladder and 4.4 x 2.4 cm soft tissue focus extending more superiorly.  Left and central pelvic mesenteric nodules with largest measuring 1.4 x 1.2 cm compared to 1.3 x 1.1 cm and another 1.7 x 1.1 cm versus 1.4 x 0.9 cm.  Development of a 2.4 x 1.0 cm focus of nodularity to the left of the bladder extending to the sigmoid colon to the anterior abdominal wall.  Stable 2.5 x 1.2 cm left external adenopathy.  -Latest available PET/CT on 05/09/2020 compared with CT scan 04/26/2020 and PET/CT 11/22/2019 showed pleural-based area of consolidation within the lungs bilaterally (most prominent posterior LUL), new since 04/26/2020, concerning for pneumonia, secondary changes of pulmonary embolic disease are more and inflammatory process such as cryptogenic organizing  pneumonia.  Lymphadenopathy in the pelvis with SUV 2.8 and soft tissue at the anastomotic site with SUV 3.9 are again identified and better seen on CT scan 04/26/2020.  Asymmetric abnormal activity noted right mandible and an  area for dental implant with SUV 6.3.  -Per your note 05/10/2020, patient was apparently treated for tooth infection with antibiotics and tooth extraction and he had a colonoscopy which was negative (not available), including anastomotic site.  He was apparently advised to continue capecitabine 2000 mg twice daily for 7 days on, 7 days off and oxaliplatin 85 mg/m every 2 weeks to a total dose of 150 mg.  Per your note, his CEA was 24 prior to starting chemotherapy and was down to 6.8 on 05/01/2020.  -Patient denies any symptoms of pneumonia or any treatment or work-up of consolidation noted on PET/CT.    -Patient states that he has been receiving chemotherapy treatments every 2 weeks, without interruption and was recently advised to continue for additional 6 months.    -He states that he received the last cycle of CAPEOX on 04/30/2020 and has restarted the next cycle of capecitabine single agent on 05/21/2020.  He has been taking capecitabine 2000 mg p.o. twice daily 1 week on, followed by 1 week off.  -Patient states that he has been tolerating the chemotherapy fairly well except for tingling numbness in his hands which does not interfere with activities, fatigue, shortness of breath with exertion and diarrhea 4-5 times a day, well controlled with Imodium and Metamucil.  He denies any interim infections, bleeding, transfusions, mouth sores, nausea or hand-foot syndrome.  -On review of systems, patient denies any aches and pains.  He did lose 33 pounds in weight after surgery but that has now resolved.  He does wear dentures and admits to fatigue, diarrhea since reversal of colostomy, shortness of breath with exertion, occasional lightheadedness, tingling numbness in hands and fingers, but does not interfere with activity.    REVIEW of SYSTEMS:  Review of Systems      Constitutional    [-] Fever / Chills / Weight loss / Malaise/Fatigue / Diaphoresis /   Weakness    Skin    [-] Rash / Itching    HENT    [-] Headaches /  Hearing loss / Tinnitus / Ear pain / Ear discharge /   Nosebleeds / Congestion / Sore throat    Eyes    [-] Blurred vision / Double vision / Eye pain / Eye discharge / Eye   redness / Photophobia    Cardiovascular    [-] Chest pain / Palpitations / Orthopnea / Claudication / Leg swelling /   PND    Respiratory    [+] Shortness of breath     [-] Cough / Hemoptysis / Sputum production / Wheezing     Comments: SOME SOB WITH EXERTION STABLE    Gastrointestinal    [+] Diarrhea     [-] Heartburn / Nausea / Abdominal pain / Constipation / Blood in stool /   Melena / Vomiting     Comments: SINCE REVERSAL IS IMPROVING -USES IMMODIUM AND FIBER    Genitourinary    [-] Dysuria / Urgency / Frequency / Hematuria / Flank pain    Musculoskeletal    [-] Myalgias / Neck pain / Back pain / Joint pain / Falls    Endo/Heme/Aller    [+] Easy bruising/bleeding     [-] Polydipsia / Environmental allergies     Comments: ON  BLOOD THINNERS    Neurological    [+] Tingling     [-] Tremor / Sensory change / Seizures / Focal weakness / Speech change /   Dizziness / Loss of consciousness     Comments: T/N HANDS AND FINGERS STABLE SINCE CHEMO    Psychological    [-] Depression / Insomnia / Memory loss / Hallucinations /   Nervous/Anxious / Substance abuse / Suicidal ideas    Edited byCelene Skeen  4:06 PM    Carlos Woods Tue May 22, 2020  3:22 PM   I have reviewed, confirmed, and made changes as appropriate.    PAST MEDICAL HISTORY:  Past Medical History:   Diagnosis Date    Cancer of sigmoid colon 09/10/2019    Diabetes     Hypertension     Pulmonary embolism on left 11/16/2019     PAST SURGICAL HISTORY:  Past Surgical History:   Procedure Laterality Date    APPENDECTOMY Right 1942    Reversal of diverting ileostomy      TOTAL COLECTOMY N/A 09/10/2019     BLOOD CLOTS HISTORY: Left pulmonary embolism 11/16/2019.  BLOOD TRANSFUSIONS: No.   PREVIOUS CHEMOTHERAPY: As noted above.  PREVIOUS RADIOTHERAPY: No.    FAMILY  HISTORY:  History reviewed. No pertinent family history.    SOCIAL HISTORY:  Social History     Social History Narrative    Patient is widowed and has 4 children.  Patient worked as a Conservation officer, historic buildings and denies any exposures to chemicals or radiation.  Patient denies smoking on a regular basis.  Patient denies regular alcohol use.    Patient denies use of chewing tobacco or illicit drugs.     ALLERGIES:  Allergies   Allergen Reactions    5 Fu [Fluorouracil] Other (See Comments)     Chest pain      MEDICATIONS:  Current Outpatient Medications   Medication Sig    apixaban (ELIQUIS) 5 MG tablet Eliquis 5 mg tablet   Take 1 tablet(s) twice a day by oral route.    aspirin 81 MG EC tablet every 24 hours    capecitabine (XELODA) 500 MG tablet capecitabine 500 mg tablet   takes 4 in the AM and the PM the week he is not on the IV infusion    ciprofloxacin (CIPRO) 500 MG tablet Cipro 500 mg tablet   Take 1 tablet twice a day by oral route for 10 days.    metFORMIN (GLUCOPHAGE) 500 MG tablet metformin 500 mg tablet   TAKE 1 TABLET BY MOUTH ONCE DAILY    tamsulosin (FLOMAX) 0.4 MG capsule every 24 hours   Patient's medications, allergies, past medical, surgical, social and family histories were reviewed with the patient and pertinent facts were updated for our records.    ADVANCE CARE DIRECTIVES:  Has patient (or family) completed any of the following? (select all that apply): Health Care Proxy (HCP)  HCP Name: Carlos Woods HCP Phone Number: [phone]  HCP available for inclusion in the chart?: No  DNR:  No.     VITAL SIGNS & PERFORMANCE STATUS:  ECOG Performance Status: 1 - Restricted in physically strenuous activity but ambulatory and able to carry out work of a light or sedentary nature, e.g., light house work, office work   Body mass index is 31.82 kg/m. Body surface area is 1.97 meters squared.  Wt Readings from Last 3 Encounters:   05/22/20 85.6 kg (  188 lb 11.2 oz)     Vital Signs    05/22/20  1513   BP: 136/72   Pulse: 78   Resp: 16   Temp: 37.1 C (98.7 F)   Weight: 85.6 kg (188 lb 11.2 oz)   Height: 164 cm (5' 4.57")   PainSc:   0 - No pain     PHYSICAL EXAM:   Exam is limited due to body habitus.  The following physical elements are noted:  WEIGHT and vital signs are as noted above.  APPEARANCE: Pleasant, well developed, well nourished, in no acute distress.  NEUROLOGICAL: Alert & oriented x 3.  PSYCHIATRIC: Mood and affect are appropriate.  HEENT: Oral exam deferred due to mask for COVID-19 patients.  NECK: Neck is supple without obvious thyromegaly.  LYMPHATICS: No palpable adenopathy in the cervical, supraclavicular, infraclavicular, axillary areas.  PULMONARY: Lungs are clear to auscultation bilaterally with no crackles, wheezes or rhonchi.  CARDIOVASCULAR: Regular rate and rhythm.   GASTROINTESTINAL: Abdomen is soft, non tender. No palpable hepatosplenomegaly.  MUSCULOSKELETAL: Extremities show no edema.    DATA REVIEWED:  -As noted above.  -Unfortunately none of the discs are available for review and have been called for.  -2D echocardiogram on 04/05/2020 showed an EF of 66%, borderline LVH, mild MR, trace TR.    RADIOLOGY & SCREENING:   Colonoscopy:  04/2020   Bone Density:  Never.    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  -S/P TOTAL ABDOMINAL COLECTOMY with ileal proctostomy and DIVERTING LOOP ILEOSTOMY on 09/12/2019 with METASTASIS to 29 of 30 lymph nodes, with extracapsular invasion, vascular invasion and POSITIVE distal and radial margins.  -S/P 12 cycles of adjuvant chemotherapy (2 cycles of FOLFOX, 10 cycles CAPEOX) from 10/23/2021 04/30/2020.  -Diagnosis and management of colon cancer was reviewed with the patient, and his companion, at length.  -The patient is status post resection of sigmoid colon cancer with perforation and obstruction  with positive margins and 29+ lymph nodes.  -We discussed the role of surgery, chemotherapy, radiotherapy in the management of colon cancer.  -Patient is at very high risk for recurrence and standard role of adjuvant chemotherapy with 12 cycles of FOLFOX and estimated decrease in the risk by about 30-40% was reviewed.  -High risk features including grade 3 tumor, 29+ lymph nodes, positive margins, extracapsular invasion, vascular invasion, perforation, obstruction were reviewed at length.  -We reviewed the risks and benefits of chemotherapy including, but not limited to risk for myelosuppression, life threatening sepsis, possible need for transfusions, fatigue, alopecia, mouth sores, nausea, vomiting.  -We also reviewed the importance of weekly blood work, the need to go to the emergency room immediately for a fever of 100.4 or higher, and the need call us for any other problems.  -The importance of having a thermometer and checking temperature for any symptoms of infection, not feeling good, chills, feeling hot and not taking Tylenol or NSAIDs to mask a low grade temperature was reviewed.  -The risks for neuropathy, cold sensitivity and Raynaud's phenomenon were reviewed. The importance of notifying us of any persistent neuropathy was reviewed.   -Risk associated with capecitabine including diarrhea, mouth sores, hand-foot syndrome, drug interactions were also reviewed.  -We discussed the risk for life threatening diarrhea and the importance of calling immediately for any diarrhea was reviewed.  -The patient will be provided with information  regarding capecitabine and oxaliplatin and we will also obtain baseline lab work today.  -It appears that the patient has now completed 12 cycles of adjuvant chemotherapy and there is no data to suggest benefit from further adjuvant chemotherapy.    -I did asked the patient to reach out to you and indicated to him that I would also call and talk to you about overall treatment  plan and number of cycles completed.  -We also discussed possible role of adjuvant radiotherapy due to positive margins.    -Possible MESENTERIC ADENOPATHY and MASS AT ANASTOMOTIC SITE noted on CT abdomen 04/27/2019.  -PET/CT 05/09/2020 with low-level uptake in the pelvic lymph nodes and anastomotic site.  -Recent colonoscopy reportedly negative.  -Concern for residual disease/recurrence was reviewed and we will obtain follow-up CT scans.    -We have also requested CT and PET/CT discs for review and comparison.    -CONSOLIDATION/PNEUMONITIS not on PET/CT on 05/09/20.  -Differential diagnosis of consolidation/pneumonitis was reviewed.  Patient appears to be asymptomatic except for baseline shortness of breath, which has not changed recently.  -I would recommend testing for SARS-CoV-2 and follow-up CT scan prior to proceeding with further chemotherapy.    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  -The risk for worsening neuropathy and possible interference with activities, after 10-12 cycles of oxaliplatin was reviewed as well.    -DIARRHEA post reversal ileostomy.  -Stable with Metamucil, Imodium and fiber.    -LEFT PULMONARY EMBOLISM noted on CTA 11/16/19: On Eliquis.    - H/O 5 FU INTOLERANCE: Chest pain with negative cath (report not available).    -H/O WEIGHT LOSS (33 lbs): Resolved.    -ADVANCE CARE DIRECTIVE:   -Advanced Care Planning was reviewed and patient will bring in copy of Health Care Proxy form.    -COVID-19 PANDEMIC.  -Patient was advised to continue COVID-19 precautions.  -Patient has completed COVID-19 vaccination with the [facility] 04/29/20.    -Findings and recommendations were reviewed with the patient.  -Opportunity to ask questions was provided. All questions were appropriately answered.  -Patient understands and is in agreement with current plan and recommendations.  -Patient was advised to call for any interim oncology or hematology problems.  I personally spent 120 minutes on the calendar day  of the encounter, including pre and post visit work.     It was a pleasure to meet  Carlos Woods and his partner Carlos Woods.  Thank you very much for allowing me to participate in his care.   I will be happy to update you regularly regarding the progress of your patient.  Please do not hesitate to call me at [phone], if you have any questions.    Yours sincerely,  Drenda Freeze, MD    ADDENDUM (05/22/2020): I did reach out to Dr. Warner Mccreedy and he indicated that he did review the patient's chart and stated that he would not recommend further adjuvant chemotherapy, at this time.  He does not think that the patient has any definite evidence of residual disease/metastases and will reach out to the patient and convey this to him.  He also noted that there was no treatment offered for findings of possible pneumonia on recent PET/CT, since patient was asymptomatic.  We also discussed possible adjuvant radiotherapy and apparently patient has previously refused radiation consultation on a couple occasions.    Author: Drenda Freeze, MD, as of: 05/22/2020  at: 7:13 PM   Associate Professor of Clinical Medicine, Hematology/Oncology Division     RECOMMENDATIONS:  -  All future appointments at [facility].    -Follow-up on 05/30/2020 at 11:15 AM.  -CBC, CMP, anemia profile, reticulocyte count, CEA today.  -CBC, CMP with next visit.  -Nasopharyngeal swab for SARS-CoV-2 today.  -CT chest abdomen pelvis 2 days PTA-please compare with previous CT chest abdomen pelvis (04/26/2020) and PET/CT (05/09/2020) from [facility] obtain disks ASAP.  -Please provide information today and schedule teaching for capecitabine and oxaliplatin.  -Please obtain chemo flowsheets, last colonoscopy (June 2021, dentist notes, labs, including CEA, prior to starting chemotherapy and last set of labs from 05/10/2020, all PET/CT discs and last CT chest abdomen pelvis discs.  Requested Prescriptions      No prescriptions requested or ordered in this encounter     Care  Team            Sofie Rower, [place]   [phone] PCP - General

## 2020-05-22 ENCOUNTER — Ambulatory Visit: Payer: Medicare (Managed Care)

## 2020-05-22 ENCOUNTER — Other Ambulatory Visit: Payer: Self-pay | Admitting: Hematology and Oncology

## 2020-05-22 ENCOUNTER — Encounter: Payer: Self-pay | Admitting: Medical Oncology

## 2020-05-22 ENCOUNTER — Ambulatory Visit: Payer: Self-pay | Admitting: Medical Oncology

## 2020-05-22 ENCOUNTER — Other Ambulatory Visit: Payer: Self-pay | Admitting: Medical Oncology

## 2020-05-22 VITALS — BP 136/72 | HR 78 | Temp 98.7°F | Resp 16 | Ht 64.57 in | Wt 188.7 lb

## 2020-05-22 DIAGNOSIS — C187 Malignant neoplasm of sigmoid colon: Secondary | ICD-10-CM

## 2020-05-22 DIAGNOSIS — Z86718 Personal history of other venous thrombosis and embolism: Secondary | ICD-10-CM | POA: Insufficient documentation

## 2020-05-22 DIAGNOSIS — C779 Secondary and unspecified malignant neoplasm of lymph node, unspecified: Secondary | ICD-10-CM | POA: Insufficient documentation

## 2020-05-22 DIAGNOSIS — T451X5A Adverse effect of antineoplastic and immunosuppressive drugs, initial encounter: Secondary | ICD-10-CM | POA: Insufficient documentation

## 2020-05-22 DIAGNOSIS — C801 Malignant (primary) neoplasm, unspecified: Secondary | ICD-10-CM | POA: Insufficient documentation

## 2020-05-22 DIAGNOSIS — Z86711 Personal history of pulmonary embolism: Secondary | ICD-10-CM

## 2020-05-22 DIAGNOSIS — R634 Abnormal weight loss: Secondary | ICD-10-CM | POA: Insufficient documentation

## 2020-05-22 DIAGNOSIS — J189 Pneumonia, unspecified organism: Secondary | ICD-10-CM

## 2020-05-22 DIAGNOSIS — C772 Secondary and unspecified malignant neoplasm of intra-abdominal lymph nodes: Secondary | ICD-10-CM

## 2020-05-22 DIAGNOSIS — Z8701 Personal history of pneumonia (recurrent): Secondary | ICD-10-CM | POA: Insufficient documentation

## 2020-05-22 DIAGNOSIS — R197 Diarrhea, unspecified: Secondary | ICD-10-CM | POA: Insufficient documentation

## 2020-05-22 DIAGNOSIS — R638 Other symptoms and signs concerning food and fluid intake: Secondary | ICD-10-CM

## 2020-05-22 MED ORDER — HEPARIN LOCK FLUSH 10 UNIT/ML IJ SOLN WRAPPED *I*
50.0000 [IU] | INTRAVENOUS | Status: DC | PRN
Start: 2020-05-22 — End: 2020-05-22
  Administered 2020-05-22: 50 [IU]

## 2020-05-22 MED ORDER — SODIUM CHLORIDE 0.9 % INJ (FLUSH) WRAPPED *I*
10.0000 mL | Status: DC | PRN
Start: 2020-05-22 — End: 2020-05-22
  Administered 2020-05-22 (×3): 10 mL

## 2020-05-22 NOTE — Progress Notes (Signed)
Port draw orders. Pt seen same day by Dr. Berton Lan.    Gerrald Basu, PA

## 2020-05-22 NOTE — Patient Instructions (Addendum)
05/28/20:  Teaching on Capecitabine & Oxaliplatin @ [place] @ 1:00 pm    CT CHEST @ [facility] [facility] @ 2:00 pm  05/30/20:  Port draw @ [facility] @ 9:30 am    Dr. Berton Lan @ [facility] @ 11:15 am      Patient: Call [facility] and have them mail the CT CHEST/ABDOMEN/PELVIS (04/26/20) & PET/CT (05/09/20) disks to you ASAP, take the disks to your CT appointment at [facility] and ask for a comparison.      Your provider today is Drenda Freeze MD.  Contact Information for the [facility] at [place] is [phone].      TREATMENT PLAN:      In an effort to provide you with the best continuity of care, please contact our office at [phone] if you are admitted to a hospital.        June 2021      Sunday Monday Tuesday Wednesday Thursday Friday Saturday             1     2     3     4     5       6     7     8     9     10     11     12       13     14     15     16     17     18     19       20     21     22     23     24     25     26       27     28     29     22 June 2020      Sunday Monday Tuesday Wednesday Thursday Friday Saturday                       1     2     3       4     5  Teaching @ [facility] @ 1:00 pm    CT CHEST @ [facility] @ 2:00 pm   6     7  Port draw @ [facility] @ 9:30 am    Dr. Soni @11:15am     8     9     10       11     12     13     14     15     16     17       18     19     20     21     22  Happy Birthday!     23     24       25     26     27     28     29     30     24 July 2020      Sunday Monday Tuesday Wednesday Thursday Friday Saturday   1     2     3     4     5     6     7       8     9     10     11     12     13     14       15     16     17     18     19     20     21       22     23     24     25     26     27     28       29     30     31                                            Patient Education      Capecitabine (Xeloda) - (By mouth)   Why this medicine is used:   Treats cancer, including breast and colorectal cancer.  Contact a nurse or doctor right  away if you have:   Chest pain, or a fast, pounding heartbeat   Unusual bleeding, bruising, or weakness, pale skin   Redness, pain, swelling, or blisters on your hands or feet   Yellow skin or eyes   Sores or white patches on your lips, mouth, or throat     Common side effects:   Diarrhea, constipation, nausea, vomiting, or stomach pain   Tingling, or burning pain in your hands, arms, legs, or feet   Swelling in your hands, ankles, or feet   Copyright [place] Information is for End User's use only and may not be sold, redistributed or otherwise used for commercial purposes.     Patient Education   Capecitabine (By mouth)   Capecitabine (kap-e-SYE-ta-been)  Treats cancer, including breast and colorectal cancer.   Brand Name(s): Capecitabine Avpak, Xeloda   There may be other brand names for this medicine.  When This Medicine Should Not Be Used:   This medicine is not right for everyone. Do not use it if you had an allergic reaction to capecitabine or 5-fluorouracil, you are pregnant, or you have DPD deficiency.  How to Use This Medicine:   Tablet   Medicines used to treat cancer are very strong and can have many side effects. Before receiving this medicine, make sure you understand all the risks and benefits. It is important for you to work closely with your doctor during your treatment.   Take your medicine as directed. Your dose may need to be changed several times to find what works best for you.   Take this medicine with food or within 30 minutes after you eat.   Swallow the tablet whole with water. Do not cut, crush, break, or chew it. If the tablet must be cut or crushed, it should be done by a pharmacist.   Read and follow the patient instructions that come with this medicine. Talk to your doctor or pharmacist if you have any questions.   Missed dose: Take a dose as  soon as you remember. If it is almost time for your next dose, wait until then and take a regular dose. Do not take  extra medicine to make up for a missed dose.   Store the medicine in a closed container at room temperature, away from heat, moisture, and direct light.  Drugs and Foods to Avoid:   Ask your doctor or pharmacist before using any other medicine, including over-the-counter medicines, vitamins, and herbal products.   Some medicines can affect how capecitabine works. Tell your doctor if you are using a blood thinner (including phenprocoumon or warfarin),  leucovorin, or phenytoin.  Warnings While Using This Medicine:    This medicine may cause birth defects if either partner is using it during conception or pregnancy. Tell your doctor right away if you or your partner becomes pregnant. Male patients must use effective birth control during treatment and for 6 months after your treatment ends. Male patients with partners of childbearing potential should also use effective contraception during treatment and for 3 months after the last dose.   Tell your doctor if you have kidney disease, liver disease, heart disease, or any type of infection.   Do not breastfeed during treatment and for 2 weeks after your last dose of this medicine.   This medicine may cause the following problems:  ? Increased risk of heart attack or other heart problems  ? Kidney failure  ? Serious skin reactions   This medicine could cause infertility. Talk with your doctor before using this medicine if you plan to have children.   This medicine may make you bleed, bruise, or get infections more easily. Take precautions to prevent illness and injury. Wash your hands often.   Your doctor will do lab tests at regular visits to check on the effects of this medicine. Keep all appointments.   Keep all medicine out of the reach of children. Never share your medicine with anyone.  Possible Side Effects While Using This Medicine:   Call your doctor right away if you notice any of these side effects:   Allergic reaction: Itching or hives, swelling in  your face or hands, swelling or tingling in your mouth or throat, chest tightness, trouble breathing   Blistering, peeling, red skin rash   Chest pain that may spread to your arms, jaw, back, or neck, trouble breathing, nausea, unusual sweating, faintness   Diarrhea 4 or more times each day, diarrhea at night, bloody bowel movements, stomach pain   Fast, pounding, or uneven heartbeat   Fever of at least 100.5 or other signs of infection, such as chills, cough, sore throat, and body aches   Redness, pain, swelling, or blisters on your hands or feet, loss of fingerprints   Severe nausea, or vomiting more than 2 times in 24 hours   Sores or white patches on your lips, mouth, or throat   Swelling in your hands, ankles, or feet, unusual tiredness   Unusual bleeding, bruising, or weakness, pale skin   Yellow skin or eyes  If you notice these less serious side effects, talk with your doctor:    Hair loss   Loss of appetite, weight loss   Mild diarrhea, constipation, nausea, vomiting, or stomach pain  If you notice other side effects that you think are caused by this medicine, tell your doctor.   Call your doctor for medical advice about side effects. You may report side effects to FDA at 1-800-FDA-1088   Copyright [place] Information is  for End User's use only and may not be sold, redistributed or otherwise used for commercial purposes.  The above information is an educational aid only. It is not intended as medical advice for individual conditions or treatments. Talk to your doctor, nurse or pharmacist before following any medical regimen to see if it is safe and effective for you.     Patient Education      Oxaliplatin (Eloxatin) - (By injection)   Why this medicine is used:   Treats cancer of the colon or rectum.  Contact a nurse or doctor right away if you have:   Dry cough, noisy breathing, or trouble breathing   Fast, pounding, or uneven heartbeat   Seizures, headache, confusion,  vision problems   Trouble swallowing, chest pressure, trouble walking, clumsiness   Numbness, tingling, or burning pain in your hands, arms, legs, feet, mouth, or throat     Common side effects:   Constipation, diarrhea   Feeling sensitive to cold   Sores or white patches on your lips, mouth, or throat   Copyright [place] Information is for End User's use only and may not be sold, redistributed or otherwise used for commercial purposes.     Patient Education   Oxaliplatin (By injection)   Oxaliplatin (ox-al-i-PLA-tin)  Treats cancer of the colon or rectum.   Brand Name(s): Oxaliplatin Novaplus   There may be other brand names for this medicine.  When This Medicine Should Not Be Used:   This medicine is not right for everyone. Do not use it if you had an allergic reaction to oxaliplatin or similar medicines, or if you are pregnant.  How to Use This Medicine:   Injectable   Medicines used to treat cancer are very strong and can have many side effects. Before receiving this medicine, make sure you understand all the risks and benefits. It is important for you to work closely with your doctor during your treatment.   Your doctor will prescribe your dose and schedule. This medicine is given through a needle placed in a vein. This medicine must be given slowly, so the needle will have to stay in place for at least 2 hours.   You will receive this medicine while you are in a hospital or cancer treatment center. A nurse or other trained health professional will give you this medicine.   If any of this medicine gets on your skin or in your eyes, nose, or mouth, tell your doctor or nurse right away.   Oxaliplatin is usually used with other medicines. This combination of medicines is usually given for 2 days, but you will receive oxaliplatin on day 1 only.   Avoid cold temperatures and cold objects during treatment with this medicine.   Read and follow the patient instructions that come with this  medicine. Talk to your doctor or pharmacist if you have any questions.   Missed dose: This medicine needs to be given on a fixed schedule. If you miss a dose, call your doctor, home health caregiver, or treatment clinic for instructions.  Drugs and Foods to Avoid:   Ask your doctor or pharmacist before using any other medicine, including over-the-counter medicines, vitamins, and herbal products.   Some foods and medicines can affect how oxaliplatin works. Tell your doctor if you are using a blood thinner or medicine to treat heart rhythm problems.   Do not use ice or drink cold beverages.  Warnings While Using This Medicine:    This medicine  may cause birth defects if either partner is using it during conception or pregnancy. Tell your doctor right away if you or your partner becomes pregnant. Use an effective form of birth control during treatment with this medicine and for at least 9 months after the last dose. Men should use an effective form of birth control during treatment with this medicine and for at least 6 months after the last dose.   Do not breastfeed during treatment with this medicine and for at least 3 months after the last dose.   Tell your doctor if you have kidney disease, liver disease, heart failure, heart rhythm problems, or lung or breathing problems.   This medicine may cause the following problems:  ? Nerve problems, including peripheral sensory neuropathy  ? Posterior reversible encephalopathy syndrome (brain disease)  ? Lung or breathing problem  ? Liver problem  ? Heart rhythm problems, including QT prolongation, ventricular arrhythmia  ? Rhabdomyolysis (serious muscle problem), which can cause kidney problems   This medicine may cause dizziness, blurred vision, or other vision problems. Do not drive or do anything else that could be dangerous until you know how this medicine affects you.   This medicine may make you bleed, bruise, or get infections more easily. Take precautions  to prevent illness and injury. Wash your hands often.   This medicine could cause infertility. Talk with your doctor before using this medicine if you plan to have children.   Cancer medicine can cause nausea or vomiting, sometimes even after you receive medicine to prevent these effects. Ask your doctor or nurse about other ways to control any nausea or vomiting that might happen.   Your doctor will do lab tests at regular visits to check on the effects of this medicine. Keep all appointments.  Possible Side Effects While Using This Medicine:   Call your doctor right away if you notice any of these side effects:   Allergic reaction: Itching or hives, swelling in your face or hands, swelling or tingling in your mouth or throat, chest tightness, trouble breathing   Dark urine or pale stools, nausea, vomiting, loss of appetite, stomach pain, yellow skin or eyes   Dry cough, noisy breathing, or trouble breathing   Fast, pounding, or uneven heartbeat   Fever, chills, cough, sore throat, body aches   Lightheadedness, dizziness, or fainting   Muscle pain, tenderness, or weakness   Numbness, tingling, or burning pain in your hands, arms, legs, feet, mouth, or throat   Pain, redness, burning, swelling, or skin changes where the needle was placed   Seizures, headache, confusion, vision problems   Trouble swallowing, chest pressure, trouble walking, clumsiness   Unusual bleeding, bruising, tiredness, or weakness  If you notice these less serious side effects, talk with your doctor:    Constipation, diarrhea   Feeling sensitive to cold   Sores or white patches on your lips, mouth, or throat  If you notice other side effects that you think are caused by this medicine, tell your doctor.   Call your doctor for medical advice about side effects. You may report side effects to FDA at 1-800-FDA-1088   Copyright [place] Information is for End User's use only and may not be sold, redistributed or  otherwise used for commercial purposes.  The above information is an educational aid only. It is not intended as medical advice for individual conditions or treatments. Talk to your doctor, nurse or pharmacist before following any medical regimen to see  if it is safe and effective for you.

## 2020-05-22 NOTE — Progress Notes (Signed)
Pt to infusion for lab draw via [facility].  Mediport accessed using sterile technique, flushed well with positive blood return. 10 cc waste obtained, labs obtained per order. Nasal swab for covid performed per order. Port flushed per protocol prior to de-accessing, site clear.

## 2020-05-23 LAB — COMPREHENSIVE METABOLIC PANEL
ALT: 17 U/L
ALT: 18 U/L
ALT: 19 U/L (ref 0–50)
AST: 28 U/L
AST: 28 U/L
AST: 34 U/L (ref 0–50)
Albumin: 3.5 g/dL
Albumin: 3.6 g/dL
Albumin: 3.8 gm/dL (ref 3.5–5.0)
Alk Phos: 57 U/L
Alk Phos: 65 U/L
Alk Phos: 88 U/L (ref 38–126)
Anion Gap: 1
Anion Gap: 1
Anion Gap: 13 MMOL/L
Bilirubin,Total: 0.4 mg/dL
Bilirubin,Total: 0.5 mg/dL
Bilirubin,Total: 0.2 mg/dL (ref 0.0–1.2)
CO2: 20 mmol/L
CO2: 21 mmol/L
CO2: 26 mmol/L (ref 21–31)
Calcium: 8.6 mg/dL
Calcium: 9.1 mg/dL
Calcium: 9.4 mg/dL (ref 8.6–10.2)
Chloride: 107 mmol/L
Chloride: 108 mmol/L
Chloride: 108 mmol/L — ABNORMAL HIGH (ref 98–107)
Creatinine: 0.9 mg/dL
Creatinine: 1 mg/dL
Creatinine: 0.8 mg/dL (ref 0.6–1.3)
GFR,Black: 112.26 mL/min/{1.73_m2} (ref 60–999)
GFR,Caucasian: 92.78 mL/min/{1.73_m2} (ref 60–999)
Glucose: 177 mg/dL
Glucose: 189 mg/dL
Glucose: 83 mg/dL (ref 60–99)
Lab: 17 mg/dL
Lab: 17 mg/dL
Lab: 18 mg/dL (ref 6–20)
Potassium: 3.8 mmol/L
Potassium: 4.1 mmol/L
Potassium: 4.1 mmol/L (ref 3.4–4.7)
Sodium: 142 mmol/L
Sodium: 143 mmol/L
Sodium: 143 mmol/L (ref 133–145)
Total Protein: 6.2 g/dL
Total Protein: 6.4 g/dL
Total Protein: 6.9 gm/dL (ref 6.0–8.3)

## 2020-05-23 LAB — CBC AND DIFFERENTIAL
Baso # K/uL: 0.04 10*3/uL
Baso # K/uL: 0.05 10*3/uL
Basophil %: 0.8 %
Basophil %: 1 %
Eos # K/uL: 0.2 10*3/uL
Eos # K/uL: 0.4 10*3/uL
Eosinophil %: 4.1 %
Eosinophil %: 7 %
Hematocrit: 34.5 %
Hematocrit: 36.3 %
Hemoglobin: 11.8 g/dL
Hemoglobin: 12.2 g/dL
Lymph # K/uL: 1.18 10*3/uL
Lymph # K/uL: 1.51 10*3/uL
Lymphocyte %: 24 %
Lymphocyte %: 30.5 %
MCV: 109 fL
MCV: 110 fL
Mono # K/uL: 0.29 10*3/uL
Mono # K/uL: 0.62 10*3/uL
Monocyte %: 7.5 %
Monocyte %: 9.8 %
Neut # K/uL: 2.19 10*3/uL
Neut # K/uL: 3.67 10*3/uL
Platelets: 135 10*3/uL
Platelets: 146 10*3/uL
RBC: 3.16 MIL/uL
RBC: 3.31 MIL/uL
RDW: 17.7 %
RDW: 19.1 %
Seg Neut %: 56.6 %
Seg Neut %: 58.2 %
WBC: 3.9 10*3/uL
WBC: 6.3 10*3/uL

## 2020-05-23 LAB — CBC
Baso # K/uL: 0.1 10*3/uL (ref 0–0.20)
Basophil %: 0.8 % (ref 0–1)
Eos # K/uL: 0.5 10*3/uL — ABNORMAL HIGH (ref 0–0.45)
Eosinophil %: 6.9 % — ABNORMAL HIGH (ref 0–5)
Hematocrit: 38.5 % — ABNORMAL LOW (ref 42.0–52.0)
Hemoglobin: 12.7 gm/DL (ref 12.0–18.0)
IMM Granulocytes: 0.3 % (ref 0–1)
Immature Granulocytes Absolute: 0.02 10*3/uL
Lymph # K/uL: 1.8 10*3/uL (ref 1.0–4.8)
Lymphocyte %: 28 % (ref 25.0–45.0)
MCH: 36 UUG — ABNORMAL HIGH (ref 29–32)
MCHC: 33 g/dl (ref 30–36)
MCV: 109.1 fl — ABNORMAL HIGH (ref 84.5–98.5)
Mono # K/uL: 0.9 10*3/uL — ABNORMAL HIGH (ref 0–0.80)
Monocyte %: 13.7 % — ABNORMAL HIGH (ref 1.7–9.3)
Neut # K/uL: 3.3 10*3/uL (ref 1.8–7.8)
Nucl RBC # K/uL: 0 10*3/uL (ref 0.0–0.0)
Nucl RBC %: 0 % (ref 0–2)
Platelets: 196 10*3/uL (ref 150–400)
RBC: 3.53 M/uL — ABNORMAL LOW (ref 4.5–6.5)
RDW: 16.5 % — ABNORMAL HIGH (ref 11.5–14.5)
Seg Neut %: 50.3 % (ref 40–60)
WBC: 6.6 10*3/uL (ref 5.0–11.0)

## 2020-05-23 LAB — VITAMIN B12/FOLATE PANEL
Folate: 10.8 ng/mL (ref 7.2–15.4)
Vitamin B12: 273 pg/mL (ref 211–911)

## 2020-05-23 LAB — ANEMIA PANEL
Ferrritin: 195
Iron: 100 ug/dL
TIBC: 263 ug/dL
Transferrin Saturation: 28 %

## 2020-05-23 LAB — FERRITIN
Ferritin: 195 ng/mL
Ferritin: 139.6 ng/mL (ref 20–250)

## 2020-05-23 LAB — IRON/UIBC
Iron: 50 ug/dL (ref 45–170)
Transferrin Saturation: 15 % (ref 15–50)
Unsat Iron Binding Capacity: 292 ug/dL (ref 155–300)

## 2020-05-23 LAB — CEA
CEA: 5.7 ng/mL
CEA: 6.8 ng/mL

## 2020-05-23 LAB — RETICULOCYTES: Retic %: 2.57 % — ABNORMAL HIGH (ref 0.5–1.5)

## 2020-05-24 LAB — CEA: CEA: 15.3 ng/mL — AB (ref 0.0–4.7)

## 2020-05-24 LAB — COVID-19 NAAT (PCR): COVID-19 NAAT (PCR): NEGATIVE

## 2020-05-24 LAB — COVID-19 PCR

## 2020-05-25 ENCOUNTER — Encounter: Payer: Self-pay | Admitting: Medical Oncology

## 2020-05-25 LAB — CEA
CEA: 24.7 ng/mL
CEA: 6.8 ng/mL

## 2020-05-28 ENCOUNTER — Other Ambulatory Visit: Payer: Self-pay | Admitting: Gastroenterology

## 2020-05-28 NOTE — Progress Notes (Signed)
PATIENT NAME: Carlos Woods (MRN: [id number])  DATE OF SERVICE: 05/30/2020          CHIEF COMPLAINT:    Carlos Woods is a 84 y.o. male who is here for further evaluation and management of COLON CANCER, after 12 cycles of adjuvant CapeOx, with restaging scans.    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  -S/P TOTAL ABDOMINAL COLECTOMY with ileal proctostomy and DIVERTING LOOP ILEOSTOMY on 09/12/2019 with METASTASIS to 29 of 30 lymph nodes, with extracapsular invasion, vascular invasion and POSITIVE distal and radial margins.  -S/P 12 cycles of ADJUVANT CHEMOTHERAPY (2 cycles of FOLFOX, 10 cycles CAPEOX) from 10/23/2021 04/30/2020.  -Patient with stage IIIC colon cancer who has been receiving adjuvant chemotherapy in [place] (Dr. Warner Mccreedy) and is in the [place] area until October and presents for further treatment and follow-up.  -History is summarized from documentation received from Dr. Frederico Hamman:  -Patient apparently presented with SIGMOID COLON OBSTRUCTION and contained PERFORATION of the colon.  -CT ABDOMEN PELVIS on 09/10/2019 reportedly showed colonic diverticulosis with acute diverticulitis sigmoid colon, bowel obstruction, likely secondary to sigmoid colon mass.  -S/P TOTAL ABDOMINAL COLECTOMY with ileal proctostomy and DIVERTING LOOP ILEOSTOMY on 09/12/2019.  -Pathology from 09/10/2019 showed pT4 pN2b, grade 3, INVASIVE MUCINOUS AND SIGNET RING ADENOCARCINOMA arising in the sigmoid diverticulum with perforation.  Tumor invaded through the bowel wall into the pericolic fat.  Radial and distal MARGINS WERE POSITIVE for tumor.  29 of 30 lymph nodes were positive for metastatic mucinous adenocarcinoma with extracapsular invasion and  vascular invasion.  Diffuse diverticulosis and bowel obstruction noted.  -Patient was seen by Dr. Frederico Hamman on 10/12/2019 and was advised staging scans, guardant 360 and adjuvant treatment with FOLFOX.  -GUARDANT 360 on 10/12/2019 showed K-ras G 12V mutation, MSS, TMB 10.58 mut/MB and VUS CDKN2A, NF1.  -Patient was apparently started on adjuvant FOLFOX on 10/24/2019 and treatment was complicated by recurrent chest pain and shortness of breath, requiring hospitalization and cardiac catheterization after the 2nd cycle of FOLFOX on 10/31/2019.    -CTA CHEST on 11/16/2019, showed a large left-sided PULMONARY EMBOLISM, and patient is currently on Eliquis.  -PET/CT on 11/22/2019 with mild activity in the upper right hilum with SUV 1.8 and activity in the left hilum with SUV 3.4, without evidence of adenopathy or mass.  No evidence of recurrent or new neoplasm noted.  -Patient only recalls 1 episode of chest pain related to PE, but was apparently switched to Xeloda on 11/28/2019, after 2 cycles of FOLFOX and has received 12 cycles of chemotherapy with 2 cycles of FOLFOX and 10 cycles of CAPEOX, per phone call to [facility], infusion RN.  Chemotherapy flowsheets are awaited.  -Per your notes, it appears that he has been receiving oxaliplatin 85 mg every 2 weeks and last treatment was administered on 04/30/2020.  -CT CAP on 04/26/2020 compared with 02/02/2020, showed new multifocal peripheral volume loss and fissural nodularity, multifocal mesenteric fat stranding and soft tissue mass about the sigmoid anastomosis 3 x 2.8 cm extending to the left posterior bladder and 4.4 x 2.4 cm soft tissue focus extending more superiorly.  Left and central pelvic mesenteric nodules with largest measuring 1.4 x 1.2 cm compared to 1.3 x 1.1  cm and another 1.7 x 1.1 cm versus 1.4 x 0.9 cm.  Development of a 2.4 x 1.0 cm focus of nodularity to the left of the bladder extending to the sigmoid colon to the anterior abdominal wall.  Stable  2.5 x 1.2 cm left external adenopathy.  -Latest available PET/CT on 05/09/2020 compared with CT scan 04/26/2020 and PET/CT 11/22/2019 showed pleural-based area of consolidation within the lungs bilaterally (most prominent posterior LUL), new since 04/26/2020, concerning for pneumonia, secondary changes of pulmonary embolic disease are more and inflammatory process such as cryptogenic organizing pneumonia.  Lymphadenopathy in the pelvis with SUV 2.8 and soft tissue at the anastomotic site with SUV 3.9 are again identified and better seen on CT scan 04/26/2020.  Asymmetric abnormal activity noted right mandible and an area for dental implant with SUV 6.3.  -Per Dr. Lorane Gell note 05/10/2020, patient was apparently treated for tooth infection with antibiotics and tooth extraction and he had a colonoscopy which was negative (not available), including anastomotic site.  He was apparently advised to continue capecitabine 2000 mg twice daily for 7 days on, 7 days off and oxaliplatin 85 mg/m every 2 weeks to a total dose of 150 mg.    -Per Soriano's note, his CEA was 24 prior to starting chemotherapy and was down to 6.8 on 05/01/2020.  -Patient stated that he has been receiving chemotherapy treatments every 2 weeks, without interruption and was recently advised to continue for additional 6 months.   -Per discussion with Dr. Warner Mccreedy on 05/22/2020, he indicated that he did review the patient's chart and stated that he did not recommend further adjuvant chemotherapy, at this time.  He did not think that the patient has any definite evidence of residual disease/metastases and will reach out to the patient and convey this to him.  He also noted that there was no treatment offered for findings of possible pneumonia on recent PET/CT, since patient was asymptomatic.  We also discussed possible adjuvant radiotherapy and apparently patient has previously refused radiation consultation on a couple occasions.     10/12/2019 Genetic  Testing    -GUARDANT 360 on 10/12/2019 showed K-ras G 12V mutation, MSS, TMB 10.58 mut/MB and was negative for MSI high, K-ras, NRAS, BRAF, or B2, NTRK. VUS NOTED WERE CDKN2A, NF1.       INTERVAL HISTORY & DATA REVIEWED:  -The patient is doing well and has no new complaints or concerns, except for rising CEA.  Patient apparently did reach out to Dr. Frederico Hamman and then to [place] via patient portal.  -He restarted the last cycle of capecitabine single agent on 05/21/2020.  He has been taking capecitabine 2000 mg p.o. twice daily 1 week on, followed by 1 week off and is not as off week.  -Patient denies any specific problems or complaints, but does state that he was started on ciprofloxacin a week ago by his PCP in [place] for "abdominal agitation".  In spite of multiple questions, he is unable to describe this any further except to say there is "a lot of traffic there".  He continues to have 4-6 soft stools off-and-on and does not feel that there is any significant change.  Denies any abdominal pain, nausea, fevers. -Patient denies any cough, shortness of breath.  He does state that he has noticed lower back pain for the last few weeks.  This is relieved with rest and worse with exertion, especially bending.  -On review of systems, the patient continues with chronic, stable tingling numbness  in his hands which does not interfere with activities, fatigue, shortness of breath with exertion and diarrhea 4-6 times a day.  -The patient completed teaching for Xeloda and oxaliplatin on 05/28/2020.    REVIEW of SYSTEMS:  Review of Systems      Constitutional    [+] Malaise/Fatigue     [-] Fever / Chills / Weight loss / Diaphoresis / Weakness     Comments: CHRONIC FEELING OF TIREDNESS 5/10 TODAY.   Skin    [-] Rash / Itching    HENT    [-] Headaches / Hearing loss / Tinnitus / Ear pain / Ear discharge /   Nosebleeds / Congestion / Sore throat    Eyes    [-] Blurred vision / Double vision / Eye pain / Eye discharge / Eye   redness /  Photophobia    Cardiovascular    [-] Chest pain / Palpitations / Orthopnea / Claudication / Leg swelling /   PND    Respiratory    [+] Shortness of breath     [-] Cough / Hemoptysis / Sputum production / Wheezing     Comments: CHRONIC SHORTNESS OF BREATH WITH EXERTION.   Gastrointestinal    [+] Diarrhea     [-] Heartburn / Nausea / Abdominal pain / Constipation / Blood in stool /   Melena / Vomiting     Comments: "AGITATION OF LOWER BOWEL" OF SEVERAL DAYS DURATION FOLLOWED BY   DIARRHEA/SOFT STOOLS 4-6 TIMES EACH DAY TO PRESENT.   Genitourinary    [-] Dysuria / Urgency / Frequency / Hematuria / Flank pain    Musculoskeletal    [-] Myalgias / Neck pain / Back pain / Joint pain / Falls    Endo/Heme/Aller    [+] Easy bruising/bleeding     [-] Polydipsia / Environmental allergies     Comments: CHRONIC EASY BRUISING WITH LONG-TERM USE OF ANTICOAGULANT   THERAPY   Neurological    [+] Tingling     [-] Tremor / Sensory change / Seizures / Focal weakness / Speech change /   Dizziness / Loss of consciousness     Comments: CHRONIC STABLE TINGLING AND NUMBNESS OF BILATERAL HANDS.   Psychological    [-] Depression / Insomnia / Memory loss / Hallucinations /   Nervous/Anxious / Substance abuse / Suicidal ideas    Edited by:    Maeola Sarah Wed May 30, 2020  1:28 PM   I have reviewed, confirmed, and made changes as appropriate.    PAST MEDICAL HISTORY:  Past Medical History:   Diagnosis Date    Cancer of sigmoid colon 09/10/2019    Diabetes     Hypertension     Pulmonary embolism on left 11/16/2019     PAST SURGICAL HISTORY:  Past Surgical History:   Procedure Laterality Date    APPENDECTOMY Right 1942    Reversal of diverting ileostomy      TOTAL COLECTOMY N/A 09/10/2019     BLOOD CLOTS HISTORY: Left pulmonary embolism 11/16/2019.  PREVIOUS CHEMOTHERAPY: As noted above.    FAMILY HISTORY:  History reviewed. No pertinent family history.    SOCIAL HISTORY:  Social History     Social History Narrative    Patient is  widowed and has 4 children.  Patient worked as a Conservation officer, historic buildings and denies any exposures to chemicals or radiation.  Patient denies smoking on a regular basis.  Patient denies regular alcohol use.    Patient denies  use of chewing tobacco or illicit drugs.     ALLERGIES:  Allergies   Allergen Reactions    5 Fu [Fluorouracil] Other (See Comments)     Chest pain      MEDICATIONS:  Current Outpatient Medications   Medication Sig    cholestyramine (QUESTRAN) 4 g packet cholestyramine (with sugar) 4 gram powder for susp in a packet   takes once q 2 days    cholecalciferol (VITAMIN D) 50 MCG (2000 UT) capsule Vitamin D3 50 mcg (2,000 unit) capsule   pt stopped taking    albuterol HFA (PROAIR HFA) 108 (90 Base) MCG/ACT inhaler every 4 hours    apixaban (ELIQUIS) 5 MG tablet Eliquis 5 mg tablet   Take 1 tablet(s) twice a day by oral route.    aspirin 81 MG EC tablet every 24 hours    capecitabine (XELODA) 500 MG tablet capecitabine 500 mg tablet   takes 4 in the AM and the PM the week he is not on the IV infusion    ciprofloxacin (CIPRO) 500 MG tablet Cipro 500 mg tablet   Take 1 tablet twice a day by oral route for 10 days.    metFORMIN (GLUCOPHAGE) 500 MG tablet metformin 500 mg tablet   TAKE 1 TABLET BY MOUTH ONCE DAILY    tamsulosin (FLOMAX) 0.4 MG capsule every 24 hours   Patient's medications, allergies, past medical, surgical, social and family histories were reviewed with the patient and pertinent facts were updated for our records.    ADVANCE CARE DIRECTIVES:  Has patient (or family) completed any of the following? (select all that apply): Health Care Proxy (HCP)  HCP Name: [facility] HCP Phone Number: [phone]  HCP available for inclusion in the chart?: No  DNR:  No.     VITAL SIGNS & PERFORMANCE STATUS:  ECOG Performance Status: 0- Fully active, able to carry on all pre-disease performance without restriction   Body mass index is 29.07 kg/m. Body surface area is 2.02 meters  squared.  Wt Readings from Last 3 Encounters:   05/30/20 85.7 kg (188 lb 15 oz)   05/22/20 85.6 kg (188 lb 11.2 oz)     Vital Signs    05/30/20 1308   BP: 142/79   Pulse: 80   Resp: 18   Temp: 36.8 C (98.3 F)   Weight: 85.7 kg (188 lb 15 oz)   Height: 171.7 cm (5' 7.6")   PainSc:   0 - No pain      PHYSICAL EXAM:   Exam is limited due to body habitus.  The following physical elements are noted:  WEIGHT and vital signs are as noted above.  APPEARANCE: Pleasant, well developed, well nourished, in no acute distress.  NEUROLOGICAL: Alert & oriented x 3.  PSYCHIATRIC: Mood and affect are appropriate.  HEENT: Oral exam deferred due to mask for COVID-19 patients.  NECK: Neck is supple without obvious thyromegaly.  LYMPHATICS: No palpable adenopathy in the cervical, supraclavicular, infraclavicular, axillary areas.  PULMONARY: Lungs are clear to auscultation bilaterally with no crackles, wheezes or rhonchi.  CARDIOVASCULAR: Regular rate and rhythm.   GASTROINTESTINAL: Abdomen is soft, non tender. No palpable hepatosplenomegaly.  MUSCULOSKELETAL: Extremities show no edema. No tenderness thoracolumbar spine.    DATA REVIEWED:  -CT chest, abdomen and pelvis on 05/28/20 compared with PET/CT 05/09/20 and CT CAP on 04/26/20 showed decreasing peripheral patchy groundglass opacities/densities in the lung.  Target lesion in the LUL was 20 x 11 versus 29 x 12  mm and RLL was 13 x 11 versus 24 x 17 mm.  Minimal peripheral groundglass opacity suggests inflammatory change, interstitial infiltrate, superimposed on fibrotic findings.  No new nodules.  31 x 30 mm spiculated appearing pelvic mass, without change, likely patient's primary neoplasm with adjacent adenopathy noted.  Largest dominant node anterior to the primary lesion is 15 x 14 mm versus 29 x 23 mm.  No evidence of bone metastases.  Thoracolumbar DJD noted.  -OLD RECORDS were obtained and reviewed:  -Colonoscopy to mid sigmoid colon on 05/07/20 showed previous end to end ileo  clonic anastomosis was seen the the mid sigmoid colon, S/P subtotal colectomy.  -On 10/12/2019, CEA was 24.7, and 4.6 on 01/09/2020, 5.7 on 04/02/2020, and 6.8 on 04/30/2020.  -Chemo flowsheets were also obtained and patient received the 1st cycle of Xeloda with oxaliplatin on 12/12/2019 and the 9th cycle on 04/30/2020.  He self initiated the 10th cycle of capecitabine alone on 05/13/2020.        Lab results: 05/30/20  1205 05/22/20  1600 04/30/20  0700   WBC 5.8 6.6 6.3   Hemoglobin 12.9 12.7 12.2   Hematocrit 38.0* 38.5* 36.30   RBC 3.65* 3.53* 3.31   Platelets 178 196 135   Neut # K/uL 3.5 3.3 3.67   Lymph # K/uL 1.4 1.8 1.51   Mono # K/uL 0.6 0.9* 0.62   Eos # K/uL 0.3 0.5* 0.4   Baso # K/uL 0.0 0.1 0.05   Seg Neut % 60.8* 50.3 58.2   Lymphocyte % 24.0* 28.0 24.0   Monocyte % 9.9* 13.7* 9.8   Eosinophil % 4.3 6.9* 7.0   Basophil % 0.7 0.8 0.8         Lab results: 05/30/20  1205 05/22/20  1600 04/30/20  0700 04/02/20  0700   MCV 104.1* 109.1* 110.0 109.0         Lab results: 05/30/20  1205 05/22/20  1600 05/01/20  0700 04/03/20  0700 04/03/20  0700   Sodium 139 143 143   < > 142   Potassium 3.8 4.1 4.1   < > 3.8   Chloride 106 108* 108   < > 107   CO2 '26 26 20   ' < > 21   UN '16 18 17   ' < > 17   Creatinine 0.9 0.8 1.00   < > 0.90   GFR,Caucasian 80.99 92.78  --   --   --    GFR,Black 98.00 112.26  --   --   --    Glucose 162* 83 177   < > 189   Calcium 9.1 9.4 9.1   < > 8.6   Total Protein 7.0 6.9 6.4   < > 6.2   Albumin 3.7 3.8 3.5   < > 3.6   ALT '16 19 17   ' < > 18   AST 28 34 28   < > 28   Alk Phos 80 88 65   < > 57   Bilirubin,Total 0.3 0.2 0.50   < > 0.40    < > = values in this interval not displayed.         Lab results: 05/22/20  1600 04/03/20  0700   Iron 50 100   Transferrin Saturation 15 28   Ferritin 139.6 195.00   TIBC  --  263   Vitamin B12 273  --    Folate 10.8  --  Lab results: 05/22/20  1600   Retic % 2.57*          Lab results: 05/22/20  1600 05/01/20  0700 04/30/20  0700 04/03/20  0700  10/12/19  0700   CEA 15.3* 6.8 6.8 5.7 24.7         Lab results: 05/22/20  1600   COVID-19 Source Nasopharyngyl   COVID-19 PCR NEG     RADIOLOGY & SCREENING:   CT CAP: 04/26/2020, 05/28/20   PET/CT 05/09/2020   Colonoscopy:  05/07/2020   Bone Density:  Never.    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  -S/P TOTAL ABDOMINAL COLECTOMY with ileal proctostomy and DIVERTING LOOP ILEOSTOMY on 09/12/2019 with METASTASIS to 29 of 30 lymph nodes, with extracapsular invasion, vascular invasion and POSITIVE distal and radial margins.  -S/P 12 cycles of adjuvant chemotherapy (2 cycles of FOLFOX, 10 cycles CAPEOX) from 10/23/2021 04/30/2020.  -Rising CEA and possible MESENTERIC ADENOPATHY and MASS AT ANASTOMOTIC SITE noted on CT abdomen 04/27/2019: Stable/improved on CT 05/28/2020.  -PET/CT 05/09/2020 with low-level uptake in the pelvic lymph nodes and anastomotic site.  -Colonoscopy 05/07/2020 negative.  -Findings of the labs and scans were reviewed with the patient and his companion, at length.  -We discussed the concern with rising CEA since nadir of 4.6 on 01/09/2020.  Possible differential diagnosis of significant recent rise in CEA, including colitis, pneumonitis, recurrent/progressive colon cancer, different lab, were all reviewed.    -We also reviewed the CT findings from 05/28/2020, which showed improvement compared to previous scans on 04/26/2020, but apparently there was no evidence of disease on PET/CT 11/22/2019.  -Possibility of gradual progression, since February 2021, based on CEA, without definite measurable disease, was reviewed.  -At this time, I would recommend a bone scan.  Patient did have a recent colonoscopy without evidence of recurrence.  -I did reach out to Dr. Frederico Hamman today, at patient's request and he was unavailable.  I did talk to his nurse practitioner, Jalene Mullet and she stated that Dr. Frederico Hamman talked to the patient for 45 minutes and recommended scans.  They did receive the CT scan results and she will contact Dr. Frederico Hamman today and have him contact me, after he reviews the scans.  -Lengthy discussion was held with the patient and his companion regarding the concern for progression/recurrence based on CEA, but no definite evidence of measurable disease based on scans.    -Further options would include continuing current treatment, changing to capecitabine with irinotecan and Avastin, versus clinical trials and evaluation at a tertiary care center, for resectability  -We previously discussed possible role of adjuvant radiotherapy due to positive margins, and he has refused.    -CONSOLIDATION/PNEUMONITIS not on PET/CT on 05/09/20: Improved on CT 05/28/2020.  -Differential diagnosis of consolidation/pneumonitis was reviewed, once again.  Patient appears to be asymptomatic except for baseline shortness of breath, which has not changed recently.    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  -The risk for worsening neuropathy and possible interference with activities, after 10-12 cycles of oxaliplatin was reviewed as well.    -DIARRHEA post reversal ileostomy.  -Stable with Metamucil, Imodium and fiber.    -LEFT PULMONARY EMBOLISM noted on CTA 11/16/19: On Eliquis.    - H/O 5 FU INTOLERANCE: Chest pain with negative cath (report not available).    -H/O WEIGHT LOSS (33 lbs): Resolved.    -  ADVANCE CARE DIRECTIVE:   -Advanced Care Planning was reviewed and patient will bring in copy of Health Care Proxy form.    -COVID-19 PANDEMIC.  -Patient was advised to continue COVID-19 precautions.  -Patient has completed COVID-19 vaccination with the [facility] 04/29/20.    -Findings and recommendations were reviewed with the patient.  -Opportunity to ask questions was provided. All questions were appropriately answered.  -Patient understands and is in agreement with current plan and  recommendations.  -Patient was advised to call for any interim oncology or hematology problems.  I personally spent 60 minutes on the calendar day of the encounter, including pre and post visit work.        Author: Drenda Freeze, MD, as of: 05/30/2020  at: 6:30 PM   Associate Professor of Clinical Medicine, Hematology/Oncology Division     RECOMMENDATIONS:  -Follow-up 2 weeks at 11.30 AM.  -CBC, CMP, CEA today and 1 day PTA.  -Bone scan within 1 week.  -Please obtain dentist notes from June 2021, ASAP  -Please provide him with names of local primary care providers.  Requested Prescriptions      No prescriptions requested or ordered in this encounter     Care Team            Sofie Rower, [facility] PCP - Kandis Mannan, [facility]

## 2020-05-28 NOTE — Addendum Note (Signed)
Addended by: Haig Prophet on: 05/28/2020 01:56 PM     Modules accepted: Orders

## 2020-05-29 ENCOUNTER — Other Ambulatory Visit: Payer: Self-pay | Admitting: Family Medicine

## 2020-05-29 ENCOUNTER — Encounter: Payer: Self-pay | Admitting: Family Medicine

## 2020-05-29 ENCOUNTER — Other Ambulatory Visit: Payer: Self-pay

## 2020-05-29 DIAGNOSIS — T451X5A Adverse effect of antineoplastic and immunosuppressive drugs, initial encounter: Secondary | ICD-10-CM

## 2020-05-29 DIAGNOSIS — G620 Drug-induced polyneuropathy: Secondary | ICD-10-CM

## 2020-05-29 DIAGNOSIS — C772 Secondary and unspecified malignant neoplasm of intra-abdominal lymph nodes: Secondary | ICD-10-CM

## 2020-05-29 DIAGNOSIS — C187 Malignant neoplasm of sigmoid colon: Secondary | ICD-10-CM

## 2020-05-29 NOTE — Progress Notes (Signed)
pt to infusion center for Xeloda and Oxaliplatin teaching, amb to infusion chair 6 with wife, teaching completed on medications, including but not limited to medication classification, uses, administration, side effects, process of treatment. Aware of when to call the doctor/infusion center. Discussed our infusion process since pt had been getting treatment in [place], pt and wife given opportunity to ask question, questions answered, aware to call infusion center if they have any further questions or to write them down and bring them to her next appt. patient verbalized understanding, aware of next scheduled appt, amb for discharge in stable condition.

## 2020-05-29 NOTE — Patient Instructions (Addendum)
06/07/20: Bone scan @ 12:45pm @ [facility]  06/12/20:  Port draw @ [facility] @ 10:30 am  06/13/20:  Dr. Berton Lan @ [facility] @ 11:30 am          Dr. Traci Sermon is a local primary provider that is accepting new patients at this time. Phone number: [phone]      Your provider today is Drenda Freeze MD.  Contact Information for the [facility] at [place] is [phone].      TREATMENT PLAN:      In an effort to provide you with the best continuity of care, please contact our office at [phone] if you are admitted to a hospital.        July 2021      Sunday Monday Tuesday Wednesday Thursday Friday Saturday                       1     2     3       4     5  Teaching @ [facility] @ 1:00 pm    CT CHEST @ [facility] @ 2:00 pm   6     7  Port draw @ [facility] @ 9:30 am    Dr. Soni @11:15am     8     9     10       11     12     13     14     15  Bone scan@ 1245   16     17       18     19     20  Port Draw@10:30AM   21  Dr. Soni@1130   22  Happy Birthday!     23     24       25     26     27     28     29     30     31 

## 2020-05-30 ENCOUNTER — Encounter: Payer: Self-pay | Admitting: Medical Oncology

## 2020-05-30 ENCOUNTER — Ambulatory Visit: Payer: Medicare (Managed Care) | Admitting: Medical Oncology

## 2020-05-30 ENCOUNTER — Other Ambulatory Visit: Payer: Self-pay | Admitting: Medical Oncology

## 2020-05-30 VITALS — BP 142/79 | HR 80 | Temp 98.3°F | Resp 18 | Ht 67.6 in | Wt 188.9 lb

## 2020-05-30 DIAGNOSIS — J189 Pneumonia, unspecified organism: Secondary | ICD-10-CM

## 2020-05-30 DIAGNOSIS — T451X5A Adverse effect of antineoplastic and immunosuppressive drugs, initial encounter: Secondary | ICD-10-CM

## 2020-05-30 DIAGNOSIS — C187 Malignant neoplasm of sigmoid colon: Secondary | ICD-10-CM

## 2020-05-30 DIAGNOSIS — C772 Secondary and unspecified malignant neoplasm of intra-abdominal lymph nodes: Secondary | ICD-10-CM

## 2020-05-30 DIAGNOSIS — Z86711 Personal history of pulmonary embolism: Secondary | ICD-10-CM

## 2020-05-30 DIAGNOSIS — G620 Drug-induced polyneuropathy: Secondary | ICD-10-CM

## 2020-05-30 DIAGNOSIS — R638 Other symptoms and signs concerning food and fluid intake: Secondary | ICD-10-CM

## 2020-05-30 DIAGNOSIS — R197 Diarrhea, unspecified: Secondary | ICD-10-CM

## 2020-05-30 LAB — COMPREHENSIVE METABOLIC PANEL
ALT: 16 U/L (ref 0–50)
AST: 28 U/L (ref 0–50)
Albumin: 3.7 gm/dL (ref 3.5–5.0)
Alk Phos: 80 U/L (ref 38–126)
Anion Gap: 11 MMOL/L
Bilirubin,Total: 0.3 mg/dL (ref 0.0–1.2)
CO2: 26 mmol/L (ref 21–31)
Calcium: 9.1 mg/dL (ref 8.6–10.2)
Chloride: 106 mmol/L (ref 98–107)
Creatinine: 0.9 mg/dL (ref 0.6–1.3)
GFR,Black: 98 mL/min/{1.73_m2} (ref 60–999)
GFR,Caucasian: 80.99 mL/min/{1.73_m2} (ref 60–999)
Glucose: 162 mg/dL — ABNORMAL HIGH (ref 60–99)
Lab: 16 mg/dL (ref 6–20)
Potassium: 3.8 mmol/L (ref 3.4–4.7)
Sodium: 139 mmol/L (ref 133–145)
Total Protein: 7 gm/dL (ref 6.0–8.3)

## 2020-05-30 LAB — CBC
Baso # K/uL: 0 10*3/uL (ref 0–0.20)
Basophil %: 0.7 % (ref 0–1)
Eos # K/uL: 0.3 10*3/uL (ref 0–0.45)
Eosinophil %: 4.3 % (ref 0–5)
Hematocrit: 38 % — ABNORMAL LOW (ref 42.0–52.0)
Hemoglobin: 12.9 gm/DL (ref 12.0–18.0)
IMM Granulocytes: 0.3 % (ref 0–1)
Immature Granulocytes Absolute: 0.02 10*3/uL
Lymph # K/uL: 1.4 10*3/uL (ref 1.0–4.8)
Lymphocyte %: 24 % — ABNORMAL LOW (ref 25.0–45.0)
MCH: 35.3 UUG — ABNORMAL HIGH (ref 29–32)
MCHC: 33.9 g/dl (ref 30–36)
MCV: 104.1 fl — ABNORMAL HIGH (ref 84.5–98.5)
Mono # K/uL: 0.6 10*3/uL (ref 0–0.80)
Monocyte %: 9.9 % — ABNORMAL HIGH (ref 1.7–9.3)
Neut # K/uL: 3.5 10*3/uL (ref 1.8–7.8)
Nucl RBC # K/uL: 0 10*3/uL (ref 0.0–0.0)
Nucl RBC %: 0 % (ref 0–2)
Platelets: 178 10*3/uL (ref 150–400)
RBC: 3.65 M/uL — ABNORMAL LOW (ref 4.5–6.5)
RDW: 15.2 % — ABNORMAL HIGH (ref 11.5–14.5)
Seg Neut %: 60.8 % — ABNORMAL HIGH (ref 40–60)
WBC: 5.8 10*3/uL (ref 5.0–11.0)

## 2020-05-30 NOTE — Progress Notes (Signed)
Patient presents to [facility] infusion today for a port draw per MD order- chart reviewed- Vitals stable- left chest mediport accessed by writer using sterile technique- brisk blood return noted- waste obtained- labs collected and sent to [facility] lab STAT- port flushed per protocol with NS and Heparin- port de-accessed- band aid applied- patient tolerated procedure well- discharged ambulatory in stable condition- patient to see Dr. Berton Lan today for further treatment plan.

## 2020-05-31 ENCOUNTER — Telehealth: Payer: Self-pay | Admitting: Medical Oncology

## 2020-05-31 LAB — CEA: CEA: 23.1 ng/mL — AB (ref 0.0–4.7)

## 2020-05-31 NOTE — Telephone Encounter (Addendum)
Confirmed with Clarene Critchley vl     ----- Message from Drenda Freeze, MD sent at 05/31/2020  1:29 PM EDT -----  Regarding: CEA  Please fax to Dr. Frederico Hamman and confirm.  Thanks.  ----- Message -----  From: Edi, Lab In [place]  Sent: 05/30/2020  12:29 PM EDT  To: Drenda Freeze, MD

## 2020-06-01 ENCOUNTER — Encounter: Payer: Self-pay | Admitting: Medical Oncology

## 2020-06-01 ENCOUNTER — Encounter: Payer: Self-pay | Admitting: Oncology

## 2020-06-01 NOTE — Progress Notes (Signed)
Dr. Lorane Gell, nurse practitioner Estill Bamberg did discuss with Dr. Frederico Hamman the elevated CEA and CT scan results.  She reported that Dr. Frederico Hamman had also spoken with Dr. Berton Lan with the recommendation for capecitabine with irinotecan and Avastin.  Estill Bamberg was also going to contact the patient.

## 2020-06-04 ENCOUNTER — Encounter: Payer: Self-pay | Admitting: Medical Oncology

## 2020-06-05 ENCOUNTER — Telehealth: Payer: Self-pay

## 2020-06-05 LAB — CEA: CEA: 4.6 ng/mL

## 2020-06-05 NOTE — Telephone Encounter (Signed)
Called pt to let him know of his chemo teaching appt scheduled on 7.15.21 at 2:00 pm.

## 2020-06-05 NOTE — Telephone Encounter (Signed)
Called patient due to opening in the schedule. Offered an appointment for 06-06-20 patient accepted. Patient had no urgent concerns at this time.

## 2020-06-05 NOTE — Progress Notes (Signed)
PATIENT NAME: Carlos Woods (MRN: [id number])  DATE OF SERVICE: 06/06/2020          CHIEF COMPLAINT:    Carlos Woods is a 84 y.o. male who is here for further evaluation and management of COLON CANCER, for rising CEA, after 12 cycles of  "adjuvant CapeOx".     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  -S/P TOTAL ABDOMINAL COLECTOMY with ileal proctostomy and DIVERTING LOOP ILEOSTOMY on 09/12/2019 with METASTASIS to 29 of 30 lymph nodes, with extracapsular invasion, vascular invasion and POSITIVE distal and radial margins.  -S/P 12 cycles of ADJUVANT CHEMOTHERAPY (2 cycles of FOLFOX, 10 cycles CAPEOX) from 10/23/2020 to 04/30/2020.  -Patient with self-medication with capecitabine alone on 05/21/2020 and 05/28/2020.  -Patient with stage IIIC colon cancer who has been receiving adjuvant chemotherapy in [place] (Dr. Warner Mccreedy) and is in the [place] area until October and presents for further treatment and follow-up.  -History is summarized from documentation received from Dr. Frederico Hamman:  -Patient apparently presented with SIGMOID COLON OBSTRUCTION and contained PERFORATION of the colon.  -CT ABDOMEN PELVIS on 09/10/2019 reportedly showed colonic diverticulosis with acute diverticulitis sigmoid colon, bowel obstruction, likely secondary to sigmoid colon mass.  -S/P TOTAL ABDOMINAL COLECTOMY with ileal proctostomy and DIVERTING LOOP ILEOSTOMY on 09/12/2019.  -Pathology from 09/10/2019 showed pT4 pN2b, grade 3, INVASIVE MUCINOUS AND SIGNET RING ADENOCARCINOMA arising in the sigmoid diverticulum with perforation.  Tumor invaded through the bowel wall into the pericolic fat.  Radial and distal MARGINS WERE POSITIVE for tumor.  29  of 30 lymph nodes were positive for metastatic mucinous adenocarcinoma with extracapsular invasion and vascular invasion.  Diffuse diverticulosis and bowel obstruction noted.  -Patient was seen by Dr. Frederico Hamman on 10/12/2019 and was advised staging scans, Guardant 360 and adjuvant treatment with FOLFOX.  -Patient was apparently STARTED ON ADJUVANT FOLFOX on 10/24/2019 and treatment was complicated by recurrent chest pain and shortness of breath, requiring hospitalization and cardiac catheterization after the 2nd cycle of FOLFOX on 10/31/2019.      -CTA CHEST on 11/16/2019, showed a large left-sided PULMONARY EMBOLISM, and patient is currently on Eliquis.  -PET/CT on 11/22/2019 with mild activity in the upper right hilum with SUV 1.8 and activity in the left hilum with SUV 3.4, without evidence of adenopathy or mass.  No evidence of recurrent or new neoplasm noted.  -Patient only recalls 1 episode of chest pain related to PE.    -Patient will apparently SWITCHED TO XELODA on 11/28/2019, after 2 cycles of FOLFOX and has received 12 cycles of chemotherapy with 2 cycles of FOLFOX and 10 cycles of CAPEOX, per phone call to [facility], infusion RN.   -The patient has been receiving oxaliplatin 85 mg every 2 weeks and received the 1st cycle of Xeloda with oxaliplatin on 12/12/2019 and the 9th cycle on 04/30/2020.   -He self initiated the 10th and 11th cycle of capecitabine alone on 05/13/2020 and 05/27/2020.    -CT CAP on 04/26/2020 compared with 02/02/2020, showed new multifocal peripheral volume loss and fissural nodularity, multifocal mesenteric fat stranding and soft tissue mass about the sigmoid anastomosis 3 x 2.8 cm extending to the left posterior bladder and 4.4 x 2.4 cm  soft tissue focus extending more superiorly.  Left and central pelvic mesenteric nodules with largest measuring 1.4 x 1.2 cm compared to 1.3 x 1.1 cm and another 1.7 x 1.1 cm versus 1.4 x 0.9 cm.  Development of a 2.4 x 1.0 cm focus of nodularity  to the left of the bladder extending to the sigmoid colon to the anterior abdominal wall.  Stable 2.5 x 1.2 cm left external adenopathy.  -Latest available PET/CT on 05/09/2020 compared with CT scan 04/26/2020 and PET/CT 11/22/2019 showed pleural-based area of consolidation within the lungs bilaterally (most prominent posterior LUL), new since 04/26/2020, concerning for pneumonia, secondary changes of pulmonary embolic disease are more and inflammatory process such as cryptogenic organizing pneumonia.  Lymphadenopathy in the pelvis with SUV 2.8 and soft tissue at the anastomotic site with SUV 3.9 are again identified and better seen on CT scan 04/26/2020.  Asymmetric abnormal activity noted right mandible and an area for dental implant with SUV 6.3.  -Colonoscopy to mid sigmoid colon on 05/07/20 showed previous end to end ileo clonic anastomosis was seen the the mid sigmoid colon, S/P subtotal colectomy.  -On 10/12/2019, CEA was 24.7, and 4.6 on 01/09/2020, 5.7 on 04/02/2020, and 6.8 on 04/30/2020.    -Per Dr. Lorane Gell note 05/10/2020, patient was apparently treated for tooth infection with antibiotics and tooth extraction and he had a colonoscopy which was negative (not available), including anastomotic site.  He was apparently advised to continue capecitabine 2000 mg twice daily for 7 days on, 7 days off and oxaliplatin 85 mg/m every 2 weeks to a total dose of 150 mg.    -Per discussion with Dr. Warner Mccreedy on 05/22/2020, he indicated that he did review the patient's chart and stated that he does not recommend further adjuvant chemotherapy, at this time.  He did not think that the patient has any definite evidence of residual disease/metastases and will reach out to the patient and convey this to him.  He also noted that there was no treatment offered for findings of possible pneumonia on recent PET/CT, since patient was asymptomatic.    -We also discussed possible adjuvant radiotherapy and apparently patient has  previously refused radiation consultation on a couple occasions.     10/12/2019 Genetic Testing    -GUARDANT 360 on 10/12/2019 showed K-ras G 12V mutation, MSS, TMB 10.58 mut/MB and was negative for MSI high, K-ras, NRAS, BRAF, or B2, NTRK. VUS NOTED WERE CDKN2A, NF1.     05/28/2020 Significant Radiology Findings    -CT chest, abdomen and pelvis on 05/28/20 compared with PET/CT 05/09/20 and CT CAP on 04/26/20 showed decreasing peripheral patchy groundglass opacities/densities in the lung.  Target lesion in the LUL was 20 x 11 versus 29 x 12 mm and RLL was 13 x 11 versus 24 x 17 mm.  Minimal peripheral groundglass opacity suggests inflammatory change, interstitial infiltrate, superimposed on fibrotic findings.  No new nodules.  31 x 30 mm spiculated appearing pelvic mass, without change, likely patient's primary neoplasm with adjacent adenopathy noted.  Largest dominant node anterior to the primary lesion is 15 x 14 mm versus 29 x 23 mm.  No evidence of bone metastases.  Thoracolumbar DJD noted.     06/11/2020 -  Chemotherapy    OP GI Colon Capecitabine Irinotecan Bevacizumab  Plan Provider: Drenda Freeze, MD  Treatment goal: Palliative  Line of treatment: [No plan line of treatment]       INTERVAL HISTORY & DATA REVIEWED:  -On 05/30/20 CEA was  23.1, this was sent to Dr, Frederico Hamman and I did leave a message for him to review further management, per patient request.  -Dr. Frederico Hamman did return my call on 06/01/2020 and we discussed patient status extensively.  We are both in agreement, that the current chemotherapy is likely not working due to rising CEA since it is nadired in February 2021.  However, we also reviewed the fact that his scans scans do not show any obvious evidence of progression or definite measurable disease.  We discussed options for close observation, since he has completed 12 cycles of adjuvant chemotherapy versus proceeding with second line chemotherapy with capecitabine and irinotecan.  Dr. Frederico Hamman did favor  proceeding with capecitabine and irinotecan, in view of rising CEA and patient's anxiety in spite of no definite progression on scans.  -His nurse practitioner, Estill Bamberg, did reach out to our office on 06/01/2020, to communicate Dr. Lorane Gell recommendation of proceeding with capecitabine, irinotecan and Avastin..  She also stated that she would contact the patient directly.  Today, the patient states that he has not heard from Dr. Lorane Gell office but and was advised to contact them directly.  -Patient did reach out to me again on 06/03/2020, requesting oxaliplatin be started right away due to rising CEA.  He  was advised regarding the above discussion and also advised a second opinion at a tertiary care center.  Chemotherapy teaching session was set up for capecitabine and irinotecan and Avastin and he was advised that we would start treatment after completion of bone scan and follow-up visit next week.  -Patient refused second opinion at a tertiary care center and is very anxious and insistent starting chemotherapy ASAP, regardless of recommendations.  He was also contacted by our clinical nurse director regarding above recommendations and was advised to come in today to review further management.  -Today, the patient is doing well and has no new complaints or concerns.  -He is unclear regarding the last cycle of single agent capecitabine that he just completed another course of self initiated chemotherapy on 06/03/2020.  He had previously stated that he started capecitabine 2000 mg p.o. twice daily 1 week on, followed by 1 week off, on 05/13/2020.  -Patient denies any lumps, cough, shortness of breath new aches and pains.  He continues with 4-6 soft loose stools and continues with Imodium and cholestyramine.  -On review of systems, the patient continues with chronic, stable tingling numbness in his hands which does not interfere with activities, fatigue, shortness of breath with exertion and diarrhea 4-6 times a  day.    REVIEW of SYSTEMS:  Review of Systems      Constitutional    [+] Malaise/Fatigue     [-] Fever / Chills / Weight loss / Diaphoresis / Weakness     Comments: CHRONIC STABLE FATIGUE 3/10   Skin    [-] Rash / Itching    HENT    [-] Headaches / Hearing loss / Tinnitus / Ear pain / Ear discharge /   Nosebleeds / Congestion / Sore throat    Eyes    [-] Blurred vision / Double vision / Eye pain / Eye discharge / Eye   redness / Photophobia    Cardiovascular    [-] Chest pain / Palpitations / Orthopnea / Claudication / Leg swelling /   PND    Respiratory    [+] Shortness of breath     [-] Cough / Hemoptysis / Sputum production / Wheezing     Comments:  CHRONIC STABLE SHORTNESS OF BREATH WITH MINIMAL EXERTION.   Gastrointestinal    [+] Diarrhea     [-] Heartburn / Nausea / Abdominal pain / Constipation / Blood in stool /   Melena / Vomiting     Comments: CHRONIC STABLE LOOSE YELLOW STOOLS 4-6X DAILY.   Genitourinary    [+] Frequency     [-] Dysuria / Urgency / Hematuria / Flank pain     Comments: CHRONIC STABLE FREQUENCY   Musculoskeletal    [-] Myalgias / Neck pain / Back pain / Joint pain / Falls    Endo/Heme/Aller    [-] Easy bruising/bleeding / Polydipsia / Environmental allergies    Neurological    [+] Tingling     [-] Tremor / Sensory change / Seizures / Focal weakness / Speech change /   Dizziness / Loss of consciousness    Psychological    [-] Depression / Insomnia / Memory loss / Hallucinations /   Nervous/Anxious / Substance abuse / Suicidal ideas    Edited by:    Maeola Sarah Wed Jun 06, 2020 11:21 AM   I have reviewed, confirmed, and made changes as appropriate.    PAST MEDICAL HISTORY:  Past Medical History:   Diagnosis Date    Cancer of sigmoid colon 09/10/2019    Diabetes     Hypertension     Pulmonary embolism on left 11/16/2019     PAST SURGICAL HISTORY:  Past Surgical History:   Procedure Laterality Date    APPENDECTOMY Right 1942    Reversal of diverting ileostomy      TOTAL COLECTOMY  N/A 09/10/2019     BLOOD CLOTS HISTORY: Left pulmonary embolism 11/16/2019.  PREVIOUS CHEMOTHERAPY: As noted above.    FAMILY HISTORY:  History reviewed. No pertinent family history.    SOCIAL HISTORY:  Social History     Social History Narrative    Patient is widowed and has 4 children.  Patient worked as a Conservation officer, historic buildings and denies any exposures to chemicals or radiation.  Patient denies smoking on a regular basis.  Patient denies regular alcohol use.    Patient denies use of chewing tobacco or illicit drugs.     ALLERGIES:  Allergies   Allergen Reactions    5 Fu [Fluorouracil] Other (See Comments)     Chest pain    Sulfa Antibiotics Other (See Comments)     unknown reaction      MEDICATIONS:  Current Outpatient Medications   Medication Sig    cholestyramine (QUESTRAN) 4 g packet cholestyramine (with sugar) 4 gram powder for susp in a packet   takes once q 2 days    cholecalciferol (VITAMIN D) 50 MCG (2000 UT) capsule Vitamin D3 50 mcg (2,000 unit) capsule   pt stopped taking    apixaban (ELIQUIS) 5 MG tablet Eliquis 5 mg tablet   Take 1 tablet(s) twice a day by oral route.    aspirin 81 MG EC tablet every 24 hours    ciprofloxacin (CIPRO) 500 MG tablet Cipro 500 mg tablet   Take 1 tablet twice a day by oral route for 10 days.    metFORMIN (GLUCOPHAGE) 500 MG tablet metformin 500 mg tablet   TAKE 1 TABLET BY MOUTH ONCE DAILY    tamsulosin (FLOMAX) 0.4 MG capsule every 24 hours    prochlorperazine (COMPAZINE) 10 MG tablet Take 1 tablet (10 mg total) by mouth every 6 hours as needed (nausea)    capecitabine (XELODA)  500 MG tablet Take 4 tablets (2,000 mg total) by mouth 2 times daily for 14 days Take for 14 days, followed by 7 days off.   Patient's medications, allergies, past medical, surgical, social and family histories were reviewed with the patient and pertinent facts were updated for our records.    ADVANCE CARE DIRECTIVES:  Has patient (or family) completed any of the following?  (select all that apply): Health Care Proxy (HCP)  HCP Name: RAYNALD ROUILLARD HCP Phone Number: [phone]  HCP available for inclusion in the chart?: No  DNR:  No.     VITAL SIGNS & PERFORMANCE STATUS:  ECOG Performance Status: 0- Fully active, able to carry on all pre-disease performance without restriction   Body mass index is 29 kg/m. Body surface area is 2.03 meters squared.  Wt Readings from Last 3 Encounters:   06/06/20 86 kg (189 lb 9.5 oz)   05/30/20 85.7 kg (188 lb 15 oz)   05/22/20 85.6 kg (188 lb 11.2 oz)     Vital Signs    06/06/20 1107   BP: 142/70   Pulse: 92   Resp: 16   Temp: 36.5 C (97.7 F)   Weight: 86 kg (189 lb 9.5 oz)   Height: 172.2 cm (5' 7.8")   PainSc:   0 - No pain      PHYSICAL EXAM:   Exam is limited due to body habitus.  The following physical elements are noted:  WEIGHT and vital signs are as noted above.  APPEARANCE: Pleasant, well developed, well nourished, in no acute distress.  NEUROLOGICAL: Alert & oriented x 3.  PSYCHIATRIC: Mood and affect are appropriate.  HEENT: Oral exam deferred due to mask for COVID-19 patients.  NECK: Neck is supple without obvious thyromegaly.  LYMPHATICS: No palpable adenopathy in the cervical, supraclavicular, infraclavicular, axillary areas.  PULMONARY: Lungs are clear to auscultation bilaterally with no crackles, wheezes or rhonchi.  CARDIOVASCULAR: Regular rate and rhythm.   GASTROINTESTINAL: Abdomen is soft, non tender. No palpable hepatosplenomegaly.  MUSCULOSKELETAL: Extremities show no edema.  Previously-no tenderness thoracolumbar spine.    DATA REVIEWED:        Lab results: 05/30/20  1205 05/22/20  1600 04/30/20  0700   WBC 5.8 6.6 6.3   Hemoglobin 12.9 12.7 12.2   Hematocrit 38.0* 38.5* 36.30   RBC 3.65* 3.53* 3.31   Platelets 178 196 135   Neut # K/uL 3.5 3.3 3.67   Lymph # K/uL 1.4 1.8 1.51   Mono # K/uL 0.6 0.9* 0.62   Eos # K/uL 0.3 0.5* 0.4   Baso # K/uL 0.0 0.1 0.05   Seg Neut % 60.8* 50.3 58.2   Lymphocyte % 24.0* 28.0 24.0   Monocyte  % 9.9* 13.7* 9.8   Eosinophil % 4.3 6.9* 7.0   Basophil % 0.7 0.8 0.8         Lab results: 05/30/20  1205 05/22/20  1600 04/30/20  0700 04/02/20  0700   MCV 104.1* 109.1* 110.0 109.0         Lab results: 05/30/20  1205 05/22/20  1600 05/01/20  0700 04/03/20  0700 04/03/20  0700   Sodium 139 143 143   < > 142   Potassium 3.8 4.1 4.1   < > 3.8   Chloride 106 108* 108   < > 107   CO2 '26 26 20   ' < > 21   UN '16 18 17   ' < > 17   Creatinine 0.9  0.8 1.00   < > 0.90   GFR,Caucasian 80.99 92.78  --   --   --    GFR,Black 98.00 112.26  --   --   --    Glucose 162* 83 177   < > 189   Calcium 9.1 9.4 9.1   < > 8.6   Total Protein 7.0 6.9 6.4   < > 6.2   Albumin 3.7 3.8 3.5   < > 3.6   ALT '16 19 17   ' < > 18   AST 28 34 28   < > 28   Alk Phos 80 88 65   < > 57   Bilirubin,Total 0.3 0.2 0.50   < > 0.40    < > = values in this interval not displayed.         Lab results: 05/22/20  1600 04/03/20  0700   Iron 50 100   Transferrin Saturation 15 28   Ferritin 139.6 195.00   TIBC  --  263   Vitamin B12 273  --    Folate 10.8  --          Lab results: 05/22/20  1600   Retic % 2.57*          Lab results: 05/30/20  1347 05/22/20  1600 05/01/20  0700 04/30/20  0700 04/03/20  0700 01/09/20  0800   CEA 23.1* 15.3* 6.8 6.8 5.7 4.6         Lab results: 05/22/20  1600   COVID-19 Source Nasopharyngyl   COVID-19 PCR NEG     RADIOLOGY & SCREENING:   CT CAP: 04/26/2020, 05/28/20   PET/CT 05/09/2020   Colonoscopy:  05/07/2020   Bone Density:  Never.    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  -S/P TOTAL ABDOMINAL COLECTOMY with ileal proctostomy and DIVERTING LOOP ILEOSTOMY on 09/12/2019 with METASTASIS to 29 of 30 lymph nodes, with extracapsular invasion, vascular invasion and POSITIVE distal and radial margins.  -S/P 12 cycles of adjuvant chemotherapy (2 cycles of FOLFOX, 10  cycles CAPEOX) from 10/23/2021 04/30/2020.  -Rising CEA and possible MESENTERIC ADENOPATHY and MASS AT ANASTOMOTIC SITE noted on CT abdomen 04/27/2019: Stable/improved on CT 05/28/2020.  -PET/CT 05/09/2020 with low-level uptake in the pelvic lymph nodes and anastomotic site.  -COLONOSCOPY 05/07/2020 negative.  -Findings of the labs and scans were reviewed with the patient and his companion, once again, at length.  -We discussed the concern with rising CEA since nadir of 4.6 on 01/09/2020.  Possible differential diagnosis of significant recent rise in CEA, including colitis, pneumonitis, recurrent/progressive colon cancer were all reviewed, again.    -We also reviewed the CT findings from 05/28/2020, which showed improvement compared to previous scans on 04/26/2020, but apparently there was no evidence of disease on PET/CT 11/22/2019.  -Likelihood of gradual progression, since February 2021, based on CEA, without definite measurable disease, was reviewed.  -At this time, we are awaiting a bone scan.   -Lengthy discussion was held with the patient and his companion regarding treatment options for the concern for progression/recurrence based on CEA, but no definite evidence of measurable disease based on scans.    -Further options would include continuing current treatment, changing to capecitabine with irinotecan and Avastin, versus clinical trials and evaluation at a tertiary care center, for resectability  -Patient is  very anxious and would like to proceed with capecitabine and irinotecan and Avastin.  -Once again, we discussed the role of evaluation at a tertiary care center regarding treatment options possible resectability and further treatment and patient absolutely refuses.  -We reviewed the risks and benefits of chemotherapy, including but not limited to, myelosuppression, life threatening sepsis, possible need for transfusions, fatigue, alopecia, mouth sores, nausea, vomiting.  -We also reviewed the importance of weekly  blood work, the need to go to the emergency room immediately for a fever of 100.4 or higher, and the need call us for any other problems.  -We also discussed the importance avoiding people with infections, following good hygiene and calling immediately for any signs and symptoms of infection.  -Importance of making sure the patient has a thermometer and checking temperature for any symptoms of infection, not feeling good, chills, feeling hot and not taking Tylenol or NSAIDs to mask a low grade temperature was reviewed.  -We discussed the risk for life threatening diarrhea with Irinotecan and the importance of calling immediately with any diarrhea was reviewed, extensively.  -The role and dose of Imodium in the aggressive management of diarrhea was reviewed.  -We also discussed the importance of maintaining a stool log, obtaining stool cultures, and importance of maintaining hydration, electrolytes and role of BRAT diet was reviewed.  -Possible risks associated with capecitabine including life-threatening diarrhea, hand foot syndrome, mucositis, drug interactions were reviewed.  -Importance of calling us immediately for any diarrhea and role of Imodium and instructions for Imodium and prevention and management of hand foot syndrome were reviewed with her as well.  -Importance of checking for drug interactions with all new oral medications was also reviewed.  -Patient was advised that the drug will arrive from a specialty pharmacy and if she should let us know if he does not hear from them within a week.   -Role of antiemetics was reviewed and the patient will be provided with a prescription for Compazine and Zofran.  -The patient will be scheduled for chemo teaching this week and we will plan on starting chemotherapy on 06/11/2020, especially in view of his recent self initiation of capecitabine completed on 06/03/2020.  Importance of not self-medicating was reviewed with the patient and his companion extensively.  -We  will also obtain PD-L1 on his original pathology.    -CONSOLIDATION/PNEUMONITIS not on PET/CT on 05/09/20: Improved on CT 05/28/2020.  -Differential diagnosis of consolidation/pneumonitis was reviewed, once again.  Patient appears to be asymptomatic except for baseline shortness of breath, which has not changed recently.    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  -The risk for worsening neuropathy and possible interference with activities, after 10-12 cycles of oxaliplatin was reviewed as well.    -DIARRHEA post reversal ileostomy.  -Stable with Metamucil, Imodium and cholestyramine.    -LEFT PULMONARY EMBOLISM noted on CTA 11/16/19: On Eliquis.    - H/O 5 FU INTOLERANCE: Chest pain with negative cath (report not available).    -H/O WEIGHT LOSS (33 lbs): Resolved.    -ADVANCE CARE DIRECTIVE:   -Advanced Care Planning was reviewed and patient will bring in copy of Health Care Proxy form.    -COVID-19 PANDEMIC.  -Patient was advised to continue COVID-19 precautions.  -Patient has completed COVID-19 vaccination with the [facility] 04/29/20.    -Findings and recommendations were reviewed with the patient.  -Opportunity to ask questions was provided. All questions were appropriately answered.  -Patient understands and is in agreement with current plan and  recommendations.  -Patient was advised to call for any interim oncology or hematology problems.  I personally spent 65 minutes on the calendar day of the encounter, including pre and post visit work.        Author: Drenda Freeze, MD, as of: 06/06/2020  at: 6:55 PM   Associate Professor of Clinical Medicine, Hematology/Oncology Division     RECOMMENDATIONS:  -Follow-up 3 weeks at 11.30 AM.  -CBC, CMP, CEA on 06/11/2020 and 1 day PTA.  -Urine dipstick 1 day PTA.  -Bone scan as scheduled on 06/07/20.  -Chemotherapy teaching as scheduled on 06/07/20 on capecitabine with irinotecan and Avastin.  -1st cycle of capecitabine and irinotecan, without Avastin on 06/11/2020.  Avastin to  start with second cycle.  -Please obtain dentist notes from June 2021, ASAP  -Patient to obtain a local primary care provider and urologist.  Requested Prescriptions     Signed Prescriptions Disp Refills    prochlorperazine (COMPAZINE) 10 MG tablet 30 tablet 0     Sig: Take 1 tablet (10 mg total) by mouth every 6 hours as needed (nausea)    capecitabine (XELODA) 500 MG tablet 112 tablet 0     Sig: Take 4 tablets (2,000 mg total) by mouth 2 times daily for 14 days Take for 14 days, followed by 7 days off.     Care Team            Sofie Rower, [place] PCP - Kandis Mannan, [facility]

## 2020-06-06 ENCOUNTER — Other Ambulatory Visit: Payer: Self-pay

## 2020-06-06 ENCOUNTER — Encounter: Payer: Self-pay | Admitting: Medical Oncology

## 2020-06-06 ENCOUNTER — Ambulatory Visit: Payer: Medicare (Managed Care) | Admitting: Medical Oncology

## 2020-06-06 ENCOUNTER — Telehealth: Payer: Self-pay

## 2020-06-06 ENCOUNTER — Other Ambulatory Visit: Payer: Self-pay | Admitting: Pharmacist Clinician (PhC)/ Clinical Pharmacy Specialist

## 2020-06-06 VITALS — BP 142/70 | HR 92 | Temp 97.7°F | Resp 16 | Ht 67.8 in | Wt 189.6 lb

## 2020-06-06 DIAGNOSIS — C187 Malignant neoplasm of sigmoid colon: Secondary | ICD-10-CM

## 2020-06-06 MED ORDER — PROCHLORPERAZINE MALEATE 10 MG PO TABS *I*
10.0000 mg | ORAL_TABLET | Freq: Four times a day (QID) | ORAL | 0 refills | Status: DC | PRN
Start: 2020-06-06 — End: 2020-07-18

## 2020-06-06 MED ORDER — CAPECITABINE 500 MG PO TABS *I*
1000.0000 mg/m2 | ORAL_TABLET | Freq: Two times a day (BID) | ORAL | 0 refills | Status: DC
Start: 2020-06-06 — End: 2020-06-27
  Filled 2020-06-06: qty 112, 21d supply, fill #0

## 2020-06-06 NOTE — Telephone Encounter (Addendum)
Late entry      ----- Message -----  From: Lynwood Dawley, RN  Sent: 06/05/2020   1:16 PM EDT  To: Drenda Freeze, MD, Lynwood Dawley, RN  Subject: treatment plans                                  Writer spoke to pt per Dr. Ruffin Donnavin request.  Writer explained that Dr. Berton Lan has spoken with Dr. Frederico Hamman, and that the plan is that he would likely need a change in treatment, once the results of his bone scan are available.  Writer also advised pt that Dr. Berton Lan strongly recommneds a second opinion at a tertiary cancer center, to which replied that he is very happy with the care he is currently receiving is not "in any hurry to run up to [facility]". Pt reports that he feels very strongly that he should not wait until after the bone scan results and next office visit without any treatment.  Writer advised pt that per Dr. Berton Lan, he could be treated one more time with [facility].    Per the pt his current capecitabine pill bottle reads "capecitabine 500mg , take 4 in the morning and 4 at night" and he has enough remaining in the bottle to get him through one more cycle. Pt reports that he would like the infusion to be scheduled asap.  Per Dr. Ruffin Christon instructions, pt to be scheduled for sooner follow-up visit with her tomorrow, 06/06/20, to discuss plans for moving forward.

## 2020-06-06 NOTE — Progress Notes (Signed)
Pt to infusion center today for teaching on Xeloda, Irinotecan and Avastin.  Pt received previous education on Xeloda medication prior to today's visit.  All medications were discussed including parameters, consent, lab work and possible side effects ( common and severe).  Pt verbalized understanding of the administration ( why drug is used, how long administration occurs, wait time after pre-medications are administered, etc;  pathology of how medications works ( in Patent attorney terms) common and severe side effects of medications, the importance of completing lab work prior to administration of immunotherapy and chemotherapy, the importance of keeping scheduled appointments, etc Pt felt satisified of the teaching provided and questions thoroughly answered by Probation officer and signed consent for Xeloda, Avastin and Irinotecan today.  Consents were passed onto Dr. Berton Lan for signature.  .  Pt verbalized understanding of all topics of discussion. Medications, eating during chemotherapy, pre-medications utilized ( on assessment of OV note), the importance of going to ER for a temperature of 100.4 degrees or higher and calling clinic.    Pt was discharged home ambulatory with his wife.  Instructed to call clinic with any questions or concerns.

## 2020-06-06 NOTE — Progress Notes (Signed)
[place] TREATMENT HAND-OFF TOOL:   SITUATION:   Scheduled treatment category for today:     Cancer treatment:  Chemotherapy    Is this a new cancer treatment?: Yes      Patient seen by MD    Consent obtained:  Yes    Location:  To be scanned    Labs complete:  Pending    Current patient status:  Scheduled treatment    OK to treat for scheduled treatment:  Yes      Okay to treat D1C1 Irinotecan without Avastin on 06/11/20 pending labs/nursing assessment.

## 2020-06-06 NOTE — Patient Instructions (Addendum)
06/11/20: Treatment @ 10:00am @ [place]  06/26/20: Port draw @ 9:30am @ [place]  06/27/20: Dr. Berton Lan @ 11:30am @ [place]  07/02/20: Treatment @ 10:00am @ [place]    -Patient to obtain a local primary care provider and urologist.         Your Provider Today is Drenda Freeze MD  Contact Information for the [facility] at [place] is [phone]        TREATMENT PLAN: capecitabine, irinotecan, and avastin       In an effort to provide you with the best continuity of care, please contact our office at [phone] if you are admitted to a hospital.    If you are in need of any Social Work services, including but not limited to:   Statistician supply   Home care services    Or, if you have any other questions, concerns, or needs, please contact [place] Social Worker, [place], [place].S.W at [phone]          July 2021      Sunday Monday Tuesday Wednesday Thursday Friday Saturday                       1     2     3       4     5     6     7     8     9     10       11     12     13     14  Dr. Soni@11am   15     16     17       18     19  Portdraw@  D1C1Treatment@10:00AM   20     21     22  Happy Birthday!     23     24       25     26     27     28     29     30     24 July 2020      Sunday Monday Tuesday Wednesday Thursday Friday Saturday   1     2     3  Portdraw/urine@9:30AM   4  Dr. Soni @1130   5     6     7       8     9  Treatment Due@10:00AM   10     11     12     13     14       15     16     17     18     19     20     21       22     23     24     25     26     27     28       29     30     31 

## 2020-06-07 ENCOUNTER — Other Ambulatory Visit: Payer: Self-pay

## 2020-06-07 NOTE — Progress Notes (Signed)
Spoke with Carlos Woods regarding prescription for capecitabine for colon cancer on the phone.    Initial assessment included review of interdisciplinary team documentation on physical health, social, environmental, economic, functional and mental components relevant to the patient's ability to adhere to the care plan with [facility].    Administration, treatment goals, adherence strategies, side effects, handling missed doses, and storage were reviewed with the patient.      Administration instructions: 2000 mg bid days 1-14/21; this is a schedule change vs. what he has been on (7 days on/7 days off); dosing remains the same     Planned Start Date: 7/19 with bevacizumab and irinotecan pending labs     Adherence plan: calendar     Potential side effects discussed: he reports good tolerance on prior treatment with capecitabine in combination with oxaliplatin     Lab schedule: every 21 days prior to cycle starts     Specialty Pharmacy: [facility]; Delivery Date: 7/16; Copay: 28.60    Medication list reviewed; list is up to date. No significant interactions identified.    Current Outpatient Medications   Medication Sig    prochlorperazine (COMPAZINE) 10 MG tablet Take 1 tablet (10 mg total) by mouth every 6 hours as needed (nausea)    capecitabine (XELODA) 500 MG tablet Take 4 tablets (2,000 mg total) by mouth 2 times daily for 14 days, followed by 7 days off.    cholestyramine (QUESTRAN) 4 g packet cholestyramine (with sugar) 4 gram powder for susp in a packet   takes once q 2 days    cholecalciferol (VITAMIN D) 50 MCG (2000 UT) capsule Vitamin D3 50 mcg (2,000 unit) capsule   pt stopped taking    apixaban (ELIQUIS) 5 MG tablet Eliquis 5 mg tablet   Take 1 tablet(s) twice a day by oral route.    aspirin 81 MG EC tablet every 24 hours    ciprofloxacin (CIPRO) 500 MG tablet Cipro 500 mg tablet   Take 1 tablet twice a day by oral route for 10 days.    metFORMIN  (GLUCOPHAGE) 500 MG tablet metformin 500 mg tablet   TAKE 1 TABLET BY MOUTH ONCE DAILY    tamsulosin (FLOMAX) 0.4 MG capsule every 24 hours       Plan to follow up with patient in two weeks when due for refill. Encouraged him to call with any questions or concerns.    Thank you for allowing me to participate in the care of this patient. Please contact me with any questions.    Lindwood Coke, PharmD  [facility] [place]  Phone: [phone]  Fax: [phone]

## 2020-06-07 NOTE — Patient Instructions (Addendum)
July 2021      Sunday Monday Tuesday Wednesday Thursday Friday Saturday                       1     2     3       4     5     6     7    FOLLOW UP VISIT   1:00 PM   (30 min.)   Soni, Neeta, MD   [facility] 8     9     10       11     12     13     14    FOLLOW UP VISIT  11:00 AM   (30 min.)   Soni, Neeta, MD   [facility] 15     16     17       18     19  Potential start date - confirm with MD office    Capecitabine  Bevacizumab  Irinotecan   20  Capecitabine   21  Capecitabine   22  Happy Birthday!  Capecitabine   23    Capecitabine 24    Capecitabine   25  Capecitabine   26  Capecitabine   27  Capecitabine   28  Capecitabine   29  Capecitabine   30  Capecitabine   31  Capecitabine               August 2021      Sunday Monday Tuesday Wednesday Thursday Friday Saturday   1  Capecitabine   2  Off week-->   3     4    FOLLOW UP VISIT  11:30 AM   (30 min.)   Soni, Neeta, MD   [facility] 5     6     7       8     9  Cycle 2   10     11     12     13     14       15     16     17     18     19     20     21       22     23     24     25     26     27     28       29     30     31                                    Capecitabine (Xeloda)    You have been diagnosed with:  Colon Cancer      Your medicine and plan of treatment is: capecitabine (Xeloda) 4 tablets by mouth twice daily for 14 days, then off for 7 days, then repeat  Avastin (bevacizumab) IV day 1, cycle every 21 days  Irinotecan IV day 1, cycle every 21 days  Lab Frequency: prior to cycle starts    What does Capecitabine (Xeloda) do?   It is used to treat certain types of cancer including but not limited to: colon cancer, rectal  cancer (with radiation), breast cancer, and stomach cancer.   Capecitabine can be referred to as oral chemotherapy.   It works by blocking the growth of the cancer cells and causing cell death.    How should Capecitabine (Xeloda) be  used?   Take with a meal or within 30 minutes of eating.   Take twice a day, one dose in the morning, one dose in the evening. Take at the same time every day.   Take with a full glass of water. Swallow the capsules whole. Do not crush, chew, or break unless directed to do so by your doctor.    What should I do if I miss a dose?   Skip the missed dose and take your next dose at the regularly scheduled time.    Never double-up or take two doses at the same time.    Are there any interactions with other drugs that I need to worry about?   Do not start any new prescriptions, over-the-counter medications, or herbal and dietary supplements without telling your doctor.   When capecitabine is used with warfarin (Coumadin) this may increase your risk for bleeding. Let your doctor know if you are taking warfarin (Coumadin).    Who should know I am taking Capecitabine (Xeloda)?    Keep a list of all your medicines (prescription, natural products, supplements, vitamins, over-the-counter) and give it to your healthcare provider     What is the effect of Capecitabine (Xeloda) on my fertility?    Pregnant women and women of child-bearing age should avoid handling this medication.   Capecitabine may affect fertility in males and females.   It is recommended for men with male partners who may get pregnant to use contraception during capecitabine treatment and for 3 months after ending treatment.    It is recommended for females who may get pregnant to use contraception while receiving capecitabine and for 6 months after ending treatment.    What side effects could occur with Capecitabine (Xeloda)?      Important things to remember about side effects:   o Side effects are almost always temporary and will go away after treatment ends.  o Most people do not have all of the side effects listed for a medication.  o In many cases, we know when a side effect is likely to begin and how long it will last.  o We can often  prescribe medications to lessen the severity of side effects.     Common Side Effects:   Red, peeling, painful skin on palms of hands and soles of feet (hand-foot syndrome)   Nausea (feel sick to your stomach), vomiting (throwing up)   Loose, watery stool (diarrhea)   Feeling tired and weak    Less Common Side Effects:   Mouth sores or mouth pain   Swelling in hands and feet   Tingling or numbness in hands or feet   Rashes   Changes in liver tests   Low red blood cells   Low white blood cell counts in blood (increased risk for infection)    Rare but Serious Side Effects   Heart problems   Dehydration and kidney failure   A severe blistering, peeling rash    What is most important to remember?   Contact your doctor if you develop any of the following:  o Any nausea or vomiting  o Persistent diarrhea ( ? 3 loose stools over baseline per day)   o Weakness or tiredness that  limits your daily activities  o Skin changes that affect your ability to do daily activities (button, open door knobs, wear shoes etc.)  o Mouth sores or pain in the mouth  o Fever >100.4 F, infections or cough that does not go away  o Signs of liver damage (dark colored urine, pain in the abdomen, yellow tint to skin and eyes, pain in the abdomen)  o Signs of bleeding (blood in urine or stool, abnormal bruising, extremely painful headache)  o Sudden chest pain, shortness of breath, irregular heartbeats, feeling faint or swollen legs or ankles  o Any new uncomfortable symptom that cannot be managed at home   Do not start any new medications, over-the-counter drugs or herbal remedies without talking to your doctor   Tell your doctor if you have been told that you lack the enzyme DPD (dihydropyrimidine dehydrogenase)   Tell all doctors, dentists and pharmacists that you are taking capecitabine        Patient Information Provided by: [facility]  [place], [place] [address]  Phone: [phone]    Updated 12/2019    Patient Education   Bevacizumab (By injection)   Bevacizumab (be-va-SIZ-yoo-mab)  Treats cancer, including colorectal, lung, glioblastoma, kidney, liver, cervical, ovarian, fallopian tube, or primary peritoneal cancer.   Brand Name(s): Avastin, Mvasi, Zirabev   There may be other brand names for this medicine.  When This Medicine Should Not Be Used:   This medicine is not right for everyone. You should not receive it if you had an allergic reaction to bevacizumab.  How to Use This Medicine:   Injectable   Your doctor will prescribe your dose and schedule. This medicine is given through a needle placed in a vein. It must be given slowly, so the needle will have to stay in place for 30 to 90 minutes every 2 or 3 weeks.   You will receive this medicine while you are in a hospital or cancer treatment center. A nurse or other trained health professional will give you this medicine.   Missed dose: This medicine needs to be given on a fixed schedule. If you miss a dose, call your doctor, home health caregiver, or treatment clinic for instructions.  Drugs and Foods to Avoid:   Ask your doctor or pharmacist before using any other medicine, including over-the-counter medicines, vitamins, and herbal products.   Do not receive bevacizumab together with cancer medicines containing anthracycline if you have heart failure.  Warnings While Using This Medicine:    It is not safe to take this medicine during pregnancy. It could harm an unborn baby. Tell your doctor right away if you become pregnant. Use an effective form of birth control to prevent pregnancy while you are receiving this medicine and for at least 6 months after your last dose.   Do not breastfeed during treatment with this medicine and for at least 6 months after the last dose.   Tell your doctor if you have liver disease, kidney disease, high blood pressure, diabetes, heart disease, heart failure, bleeding problems, bowel  blockage, or a history of heart attack, stroke, or blood clots. Tell your doctor if you had a recent surgery.   This medicine may cause the following problems:   ? Stomach or bowel perforation (hole or tear)  ? Fistula (abnormal opening) formation in the stomach, bowel, or other parts of the body  ? Increased risk for bleeding problems, blood clots, heart attack, or  stroke  ? High blood pressure  ? Posterior reversible encephalopathy syndrome (brain disease)  ? Kidney problems  ? Infusion reaction   This medicine may affect the way your body heals. Tell any doctor who treats you that you are using this medicine. You may need to stop receiving it at least 28 days before or after having surgery and until the wound has healed enough.   This medicine may cause problems in the ovaries. Talk with your doctor if you plan to have children. Some women are not able to get pregnant after they have used this medicine.   Medicines used to treat cancer are very strong and can have many side effects. Before receiving this medicine, make sure you understand all the risks and benefits. It is important for you to work closely with your doctor during your treatment.   This medicine may make you bleed, bruise, or get infections more easily. Take precautions to prevent illness and injury. Wash your hands often.   Your doctor will do lab tests at regular visits to check on the effects of this medicine. Keep all appointments.  Possible Side Effects While Using This Medicine:   Call your doctor right away if you notice any of these side effects:   Allergic reaction: Itching or hives, swelling in your face or hands, swelling or tingling in your mouth or throat, chest tightness, trouble breathing   Bleeding from your rectum, black, tarry stools   Blurred vision, dizziness, nervousness, pounding in the ears, slow or fast heartbeat   Change in how much or how often you urinate, lower back or side pain, cloudy urine   Chest pain,  coughing up blood, sudden or severe headache, problems with vision, speech, or walking, pain in your calf   Constipation, stomach pain, nausea, vomiting   Fever, chills, trouble breathing, fainting, chest pain within a few hours after you receive this medicine   Numbness or weakness in your arm or leg, or on one side of your body   Seizures, confusion, unusual drowsiness   Swelling in your hands, ankles, or feet   Trouble breathing or swallowing, coughing or choking while you eat, cold sweat, bluish-colored skin   Unusual bleeding, bruising, or weakness, nosebleeds   Vomiting blood or material that looks like coffee grounds  If you notice these less serious side effects, talk with your doctor:    Diarrhea, loss of appetite, stomach upset, change in sense of taste   Dry eyes or skin, redness, swelling, or scaly skin   Pain, itching, burning, swelling, or a lump under your skin where the needle is placed   Stuffy or runny nose   Tiredness  If you notice other side effects that you think are caused by this medicine, tell your doctor.   Call your doctor for medical advice about side effects. You may report side effects to FDA at 1-800-FDA-1088   Copyright [place] Information is for End User's use only and may not be sold, redistributed or otherwise used for commercial purposes.  The above information is an educational aid only. It is not intended as medical advice for individual conditions or treatments. Talk to your doctor, nurse or pharmacist before following any medical regimen to see if it is safe and effective for you.     Patient Education   Irinotecan (By injection)   Irinotecan (eye-ri-noe-TEE-kan)  Treats cancer, including cancer of the colon or rectum.   Brand Name(s): [employer] Choice Irinotecan Hydrochloride, Camptosar, Camptosar  Novaplus, Irinotecan HCl Novaplus, PremierPro Rx Irinotecan HCl   There may be other brand names for this medicine.  When This Medicine Should Not Be Used:    This medicine is not right for everyone. You should not receive it if you had an allergic reaction to irinotecan or you are pregnant.  How to Use This Medicine:   Injectable   You will receive this medicine while you are in a hospital or cancer treatment center. A nurse or other trained health professional will give you this medicine.   Your doctor will prescribe your dose and schedule. This medicine is given through a needle placed in a vein. This medicine is given slowly, so the needle will remain in place for about 90 minutes.   Missed dose: This medicine needs to be given on a fixed schedule. If you miss a dose, call your doctor, home health caregiver, or treatment clinic for instructions.  Drugs and Foods to Avoid:   Ask your doctor or pharmacist before using any other medicine, including over-the-counter medicines, vitamins, and herbal products.   Some medicines can affect how irinotecan works. You may also need to stop taking certain medicines before you start taking irinotecan. Tell your doctor if you are using any of the following:   ? Gemfibrozil, nefazodone, St John's wort, telaprevir  ? Medicine to treat an infection (including clarithromycin, itraconazole, ketoconazole, rifabutin, rifampin, voriconazole)  ? Medicine to treat HIV infection (including atazanavir, indinavir, lopinavir, nelfinavir, ritonavir, saquinavir)  ? Medicine to treat seizures (including carbamazepine, phenytoin, phenobarbital)  ? Radiation treatment  Warnings While Using This Medicine:    It is not safe to take this medicine during pregnancy. It could harm an unborn baby. Tell your doctor right away if you become pregnant. Women should not get pregnant for at least 6 months after treatment ends. Men should not father a child for at least 3 months after treatment ends.   Do not breastfeed during treatment and for at least 7 days after your last dose of this medicine.   Tell your doctor if you have kidney disease, liver  disease, Rosanna Randy syndrome, lung problems, bone marrow disease, any type of infection, or an enzyme problem called reduced UGT1A1 activity.   Medicines used to treat cancer are very strong and can have many side effects. Before receiving this medicine, make sure you understand all the risks and benefits. It is important for you to work closely with your doctor during your treatment.   This medicine may cause the following problems:   ? Kidney problems  ? Lung problems   This medicine often causes diarrhea. Diarrhea can begin while you are receiving a dose of medicine or shortly afterward, or it may occur more than 24 hours later. Your doctor may give you medicine to treat diarrhea. Follow instructions carefully.   This medicine may make you dizzy or cause vision problems. Do not drive or do anything that could be dangerous until you know how this medicine affects you.   This medicine may make you bleed, bruise, or get infections more easily. Take precautions to prevent illness and injury. Wash your hands often.   This medicine could cause infertility. Talk with your doctor before using this medicine if you plan to have children.   Cancer medicine can cause nausea or vomiting, sometimes even after you receive medicine to prevent these effects. Ask your doctor or nurse about other ways to control any nausea or vomiting that might happen.   Your  doctor will do lab tests at regular visits to check on the effects of this medicine. Keep all appointments.  Possible Side Effects While Using This Medicine:   Call your doctor right away if you notice any of these side effects:   Allergic reaction: Itching or hives, swelling in your face or hands, swelling or tingling in your mouth or throat, chest tightness, trouble breathing   Blurred vision   Decrease in how much or how often you urinate, bloody urine, lower back or side pain   Fever, chills, cough, sore throat, body aches   Lightheadedness, dizziness,  fainting   Runny nose, watery eyes, flushing, slow heartbeat   Severe diarrhea, nausea, or vomiting, stomach pain or cramping, red or black stools   Trouble breathing, fever, or cough   Unusual bleeding, bruising, or weakness  If you notice these less serious side effects, talk with your doctor:    Constipation, upset stomach, loss of appetite, weight loss   Hair loss   Redness, pain, or swelling where the needle is placed  If you notice other side effects that you think are caused by this medicine, tell your doctor.   Call your doctor for medical advice about side effects. You may report side effects to FDA at 1-800-FDA-1088   Copyright [place] Information is for End User's use only and may not be sold, redistributed or otherwise used for commercial purposes.  The above information is an educational aid only. It is not intended as medical advice for individual conditions or treatments. Talk to your doctor, nurse or pharmacist before following any medical regimen to see if it is safe and effective for you.

## 2020-06-08 ENCOUNTER — Other Ambulatory Visit: Payer: Self-pay | Admitting: Gastroenterology

## 2020-06-08 ENCOUNTER — Other Ambulatory Visit: Payer: Self-pay

## 2020-06-11 ENCOUNTER — Other Ambulatory Visit: Payer: Self-pay | Admitting: Medical Oncology

## 2020-06-11 LAB — COMPREHENSIVE METABOLIC PANEL
ALT: 16 U/L (ref 0–50)
AST: 27 U/L (ref 0–50)
Albumin: 3.7 gm/dL (ref 3.5–5.0)
Alk Phos: 82 U/L (ref 38–126)
Anion Gap: 12 MMOL/L
Bilirubin,Total: 0.3 mg/dL (ref 0.0–1.2)
CO2: 26 mmol/L (ref 21–31)
Calcium: 9.4 mg/dL (ref 8.6–10.2)
Chloride: 104 mmol/L (ref 98–107)
Creatinine: 0.8 mg/dL (ref 0.6–1.3)
GFR,Black: 112.26 mL/min/{1.73_m2} (ref 60–999)
GFR,Caucasian: 92.78 mL/min/{1.73_m2} (ref 60–999)
Glucose: 165 mg/dL — ABNORMAL HIGH (ref 60–99)
Lab: 14 mg/dL (ref 6–20)
Potassium: 4 mmol/L (ref 3.4–4.7)
Sodium: 138 mmol/L (ref 133–145)
Total Protein: 7.1 gm/dL (ref 6.0–8.3)

## 2020-06-11 LAB — CBC
Baso # K/uL: 0.1 10*3/uL (ref 0–0.20)
Basophil %: 0.9 % (ref 0–1)
Eos # K/uL: 0.6 10*3/uL — ABNORMAL HIGH (ref 0–0.45)
Eosinophil %: 10.1 % — ABNORMAL HIGH (ref 0–5)
Hematocrit: 37.7 % — ABNORMAL LOW (ref 42.0–52.0)
Hemoglobin: 12.6 gm/DL (ref 12.0–18.0)
IMM Granulocytes: 0.2 % (ref 0–1)
Immature Granulocytes Absolute: 0.01 10*3/uL
Lymph # K/uL: 1.3 10*3/uL (ref 1.0–4.8)
Lymphocyte %: 24.1 % — ABNORMAL LOW (ref 25.0–45.0)
MCH: 35.8 UUG — ABNORMAL HIGH (ref 29–32)
MCHC: 33.4 g/dl (ref 30–36)
MCV: 107.1 fl — ABNORMAL HIGH (ref 84.5–98.5)
Mono # K/uL: 0.5 10*3/uL (ref 0–0.80)
Monocyte %: 9.2 % (ref 1.7–9.3)
Neut # K/uL: 3 10*3/uL (ref 1.8–7.8)
Nucl RBC # K/uL: 0 10*3/uL (ref 0.0–0.0)
Nucl RBC %: 0 % (ref 0–2)
Platelets: 189 10*3/uL (ref 150–400)
RBC: 3.52 M/uL — ABNORMAL LOW (ref 4.5–6.5)
RDW: 15.6 % — ABNORMAL HIGH (ref 11.5–14.5)
Seg Neut %: 55.5 % (ref 40–60)
WBC: 5.4 10*3/uL (ref 5.0–11.0)

## 2020-06-11 LAB — CEA: CEA: 38.5 ng/mL — AB (ref 0.0–4.7)

## 2020-06-11 MED FILL — Irinotecan HCl Inj 100 MG/5ML (20 MG/ML): INTRAVENOUS | Qty: 18.25 | Status: AC

## 2020-06-11 NOTE — Progress Notes (Signed)
Patient arrived to infusion center for his treatment. Ambulatory to chair 5. Vitals- BP 150/71, 02 95%, 18 resp, 97.9 temp, 80 HR.  Orders/consent/hand off/dosages reviewed. Port accessed with sterile technique with blood return. Blood waste obtained per protocol. Labs obtained per order and sent to lab. Labs resulted and reviewed. Confirmed home anti-diarrhea and anti-antiemetic home regimens. Pre meds given. C1D1 of Irinotecan 365mg  hooked up and infused over approx. 90 minutes per order. Patient tolerated well. Line flushed. Port flushed with saline and heparin per protocol. De-accessed port. Spot bandaid applied. Patient aware of next scheduled appointment. Ambulatory for discharge in stable condition accompanied by wife.

## 2020-06-12 ENCOUNTER — Encounter: Payer: Self-pay | Admitting: Medical Oncology

## 2020-06-13 ENCOUNTER — Other Ambulatory Visit: Payer: Self-pay

## 2020-06-13 ENCOUNTER — Telehealth: Payer: Self-pay

## 2020-06-13 ENCOUNTER — Ambulatory Visit: Payer: Medicare (Managed Care) | Admitting: Medical Oncology

## 2020-06-13 DIAGNOSIS — C187 Malignant neoplasm of sigmoid colon: Secondary | ICD-10-CM

## 2020-06-13 DIAGNOSIS — C772 Secondary and unspecified malignant neoplasm of intra-abdominal lymph nodes: Secondary | ICD-10-CM

## 2020-06-13 DIAGNOSIS — R197 Diarrhea, unspecified: Secondary | ICD-10-CM

## 2020-06-13 DIAGNOSIS — G620 Drug-induced polyneuropathy: Secondary | ICD-10-CM

## 2020-06-13 DIAGNOSIS — T451X5A Adverse effect of antineoplastic and immunosuppressive drugs, initial encounter: Secondary | ICD-10-CM

## 2020-06-13 NOTE — Telephone Encounter (Signed)
Pt sent message stating he is still having agitation in lower abdomen and will give stool sample. Per Dr. Berton Lan called to make sure pt was not having any diarrhea. Pt states that BM are at his baseline. Went over diarrhea teaching per Dr. Berton Lan. Also Per Dr. Berton Lan pt will collect stool for culture and C-Diff.

## 2020-06-14 ENCOUNTER — Other Ambulatory Visit: Payer: Self-pay | Admitting: Medical Oncology

## 2020-06-15 ENCOUNTER — Encounter: Payer: Self-pay | Admitting: Medical Oncology

## 2020-06-16 LAB — BACTERIAL GI PCR PANEL

## 2020-06-16 LAB — PARASITE GI PCR PANEL

## 2020-06-19 ENCOUNTER — Encounter: Payer: Self-pay | Admitting: Medical Oncology

## 2020-06-22 ENCOUNTER — Other Ambulatory Visit: Payer: Self-pay

## 2020-06-25 ENCOUNTER — Encounter: Payer: Self-pay | Admitting: Medical Oncology

## 2020-06-25 NOTE — Progress Notes (Signed)
PATIENT NAME: Carlos Woods (MRN: [id number])  DATE OF SERVICE: 06/27/2020          CHIEF COMPLAINT:    Carlos Woods is a 84 y.o. male who is here for further evaluation and management of COLON CANCER, prior to the 2nd cycle of  XELIRI and Avastin.    ONCOLOGY HISTORY / HISTORY OF PRESENT ILLNESS:  Oncology History   Cancer of sigmoid colon   09/10/2019 Genetic Testing    -MOLECULAR TESTING ON SIGMOID COLON specimen from 09/10/2019, by [facility] was positive for K-ras pathogenic/p.g12V, negative for ERBB2/HER-2/neu, and other cancer type relevant biomarkers including MSI (stable), PD-L1 (0%),NTRK1/2/3, TMB low (3 mut/mb), BRAF, NRAS, PIK3CA.  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  -S/P TOTAL ABDOMINAL COLECTOMY with ileal proctostomy and DIVERTING LOOP ILEOSTOMY on 09/12/2019 with METASTASIS to 29 of 30 lymph nodes, with extracapsular invasion, vascular invasion and POSITIVE distal and radial margins.  -S/P 12 cycles of ADJUVANT CHEMOTHERAPY (2 cycles of FOLFOX, 10 cycles CAPEOX) from 10/23/2020 to 04/30/2020.  -Patient with self-medication with capecitabine alone on 05/21/2020 and 05/28/2020.  -Patient with stage IIIC colon cancer who has been receiving adjuvant chemotherapy in [place] (Dr. Warner Mccreedy) and is in the [place] area until October and presents for further treatment and follow-up.  -History is summarized from documentation received from Dr. Frederico Hamman:  -Patient apparently presented with SIGMOID COLON OBSTRUCTION and contained PERFORATION of the colon.  -CT ABDOMEN PELVIS on 09/10/2019 reportedly showed colonic diverticulosis with acute diverticulitis sigmoid colon, bowel obstruction, likely secondary to sigmoid colon  mass.  -S/P TOTAL ABDOMINAL COLECTOMY with ileal proctostomy and DIVERTING LOOP ILEOSTOMY on 09/12/2019.  -Pathology from 09/10/2019 showed pT4 pN2b, grade 3, INVASIVE MUCINOUS AND SIGNET RING ADENOCARCINOMA arising in the sigmoid diverticulum with perforation.  Tumor invaded through the bowel wall into the pericolic fat.  Radial and distal MARGINS WERE POSITIVE for tumor.  29 of 30 lymph nodes were positive for metastatic mucinous adenocarcinoma with extracapsular invasion and vascular invasion.  Diffuse diverticulosis and bowel obstruction noted.  -Patient was seen by Dr. Frederico Hamman on 10/12/2019 and was advised staging scans, Guardant 360 and adjuvant treatment with FOLFOX.  -Patient was apparently STARTED ON ADJUVANT FOLFOX on 10/24/2019 and treatment was complicated by recurrent chest pain and shortness of breath, requiring hospitalization and cardiac catheterization after the 2nd cycle of FOLFOX on 10/31/2019.      -CTA CHEST on 11/16/2019, showed a large left-sided PULMONARY EMBOLISM, and patient is currently on Eliquis.  -PET/CT on 11/22/2019 with mild activity in the upper right hilum with SUV 1.8 and activity in the left hilum with SUV 3.4, without evidence of adenopathy or mass.  No evidence of recurrent or new neoplasm noted.  -Patient only recalls 1 episode of chest pain related to PE.    -Patient was apparently SWITCHED TO XELODA on 11/28/2019, after 2 cycles of FOLFOX and has received 12 cycles of chemotherapy with 2 cycles of FOLFOX and 10 cycles of CAPEOX, per phone call to [facility], infusion RN.   -CT CAP on 04/26/2020 compared with 02/02/2020, showed new multifocal peripheral volume loss and fissural nodularity, multifocal mesenteric fat stranding and soft tissue mass about the sigmoid anastomosis 3 x 2.8 cm  extending to the left posterior bladder and 4.4 x 2.4 cm soft tissue focus extending more superiorly.  Left and central pelvic mesenteric nodules with largest measuring 1.4 x 1.2 cm  compared to 1.3 x 1.1 cm and another 1.7 x 1.1 cm versus 1.4 x 0.9 cm.  Development of a 2.4 x 1.0 cm focus of nodularity to the left of the bladder extending to the sigmoid colon to the anterior abdominal wall.  Stable 2.5 x 1.2 cm left external adenopathy.  -Latest available PET/CT on 05/09/2020 compared with CT scan 04/26/2020 and PET/CT 11/22/2019 showed pleural-based area of consolidation within the lungs bilaterally (most prominent posterior LUL), new since 04/26/2020, concerning for pneumonia, secondary changes of pulmonary embolic disease are more and inflammatory process such as cryptogenic organizing pneumonia.  Lymphadenopathy in the pelvis with SUV 2.8 and soft tissue at the anastomotic site with SUV 3.9 are again identified and better seen on CT scan 04/26/2020.  Asymmetric abnormal activity noted right mandible and an area for dental implant with SUV 6.3.  -The patient has been receiving oxaliplatin 85 mg every 2 weeks and received the 1st cycle of Xeloda with oxaliplatin on 12/12/2019 and the 9th cycle on 04/30/2020.     -Per Dr. Lorane Gell note 05/10/2020, patient was apparently treated for tooth infection with antibiotics and tooth extraction and he had a colonoscopy which was negative, including anastomotic site.    -Patient was apparently advised to continue capecitabine 2000 mg twice daily for 7 days on, 7 days off and oxaliplatin 85 mg/m every 2 weeks to a total dose of 150 mg.    -Colonoscopy to mid sigmoid colon on 05/07/20 showed previous end to end ileo clonic anastomosis was seen the the mid sigmoid colon, S/P subtotal colectomy.  -On 10/12/2019, CEA was 24.7, and 4.6 on 01/09/2020, 5.7 on 04/02/2020, and 6.8 on 04/30/2020.      -Patient was first seen at [facility] on 05/22/2020.  -Per discussion with Dr. Warner Mccreedy on 05/22/2020, he indicated that he did review the patient's chart and stated that he does not recommend further adjuvant chemotherapy, at this time.  He did not think that the patient has any  definite evidence of residual disease/metastases and will reach out to the patient and convey this to him.  He also noted that there was no treatment offered for findings of possible pneumonia on recent PET/CT, since patient was asymptomatic.    -We also discussed possible adjuvant radiotherapy and apparently patient has previously refused radiation consultation on a couple occasions.  -Patient self initiated the 10th and 11th cycle of capecitabine alone on 05/13/2020 and 05/27/2020.     10/12/2019 Genetic Testing    -GUARDANT 360 on 10/12/2019 showed K-ras G 12V mutation, MSS, TMB 10.58 mut/MB and was negative for MSI high, K-ras, NRAS, BRAF, ERBB2, NTRK. VUS NOTED WERE CDKN2A, NF1.     05/28/2020 Significant Radiology Findings    -On 10/12/2019, CEA was 24.7, and 4.6 on 01/09/2020.  -Rising CEA noted - 5.7 on 04/02/2020, 6.8 on 04/30/2020, 15.3 on 05/22/2020 and 38.5 on 06/11/2020.  -CT chest, abdomen and pelvis on 05/28/20 compared with PET/CT 05/09/20 and CT CAP on 04/26/20 showed decreasing peripheral patchy groundglass opacities/densities in the lung.  Target lesion in the LUL was 20 x 11 versus 29 x 12 mm and RLL was 13 x 11 versus 24 x 17 mm.  Minimal peripheral groundglass opacity suggests inflammatory change, interstitial infiltrate, superimposed on fibrotic findings.  No new nodules.  31 x  30 mm spiculated appearing pelvic mass, without change, likely patient's primary neoplasm with adjacent adenopathy noted.  Largest dominant node anterior to the primary lesion is 15 x 14 mm versus 29 x 23 mm.    -On 05/30/20 CEA was 23.1, this was sent to Dr, Frederico Hamman and I did leave a message for him to review further management, per patient request.  -Dr. Frederico Hamman did return my call on 06/01/2020 and we discussed patient status extensively.  We are both in agreement, that the current chemotherapy is likely not working due to rising CEA since it is nadired in February 2021.  However, we also reviewed the fact that his scans scans do not  show any obvious evidence of progression or definite measurable disease.  We discussed options for close observation, since he has completed 12 cycles of adjuvant chemotherapy versus proceeding with second line chemotherapy with capecitabine and irinotecan.  Dr. Frederico Hamman did favor proceeding with capecitabine and irinotecan, in view of rising CEA and patient's anxiety in spite of no definite progression on scans.  -His nurse practitioner, Estill Bamberg, did reach out to our office on 06/01/2020, to communicate Dr. Lorane Gell recommendation of proceeding with capecitabine, irinotecan and Avastin.    -Patient has repeatedly refused another opinion or tertiary cancer center evaluation.  -Per discussion with Dr. Frederico Hamman, capecitabine, irinotecan and Avastin started 06/11/2020.     06/11/2020 -  Chemotherapy    OP GI Colon Capecitabine Irinotecan Bevacizumab  Plan Provider: Drenda Freeze, MD  Treatment goal: Palliative  Line of treatment: [No plan line of treatment]       INTERVAL HISTORY & DATA REVIEWED:  -Patient completed teaching on Xeloda, Irinotecan and Avastin on 06/06/20.   -He started the 1st cycle of capecitabine and irinotecan on 06/11/2020.  -Patient sent message on 06/13/20, stating that he had continuing agitation lower abdomen and would give a stool specimen.  Patient was called and to make sure he was not having diarrhea, patient stated that bowel movements are at his baseline.  Diarrhea teaching was reviewed and patient was advised to collect stool for culture and C-Diff.   -The patient is doing well and has no new complaints or concerns.  -Patient tolerated last chemotherapy cycle with no problems, however he states that he is on day 3 Xeloda today.  He states that he did not "think that it made sense to start Xeloda on day 1 with the the irinotecan" and started 1 week later.,  After much discussion, it appears that he did not start 1 week later, on 06/18/2020, either, even though his 1 calendar states Xeloda on 06/18/2020,  followed by.  -The patient continues with 4-6 soft loose stools, without change, and continues with Imodium and cholestyramine.    -He states that he has noted mild right subscapular discomfort, when he moves a certain way or touches the area.  He wonders if this is related to pneumonia, but denies any cough, fevers or change in baseline shortness of breaths.  We discussed possible chest x-ray and he refuses.  -He denies any lumps, other aches and pains, interim infections or transfusions.  -On review of systems, the patient continues with chronic, stable tingling numbness in his hands which does not interfere with activities, fatigue, shortness of breath with exertion and diarrhea 4-6 times a day.  -He also sent a message 2 days ago questioning radiotherapy.  Today he states that he has been receiving calls from [place] regarding possible radiation therapy    REVIEW of SYSTEMS:  Review of Systems      Constitutional    [-] Fever / Chills / Weight loss / Malaise/Fatigue / Diaphoresis /   Weakness    Skin    [-] Rash / Itching    HENT    [-] Headaches / Hearing loss / Tinnitus / Ear pain / Ear discharge /   Nosebleeds / Congestion / Sore throat    Eyes    [-] Blurred vision / Double vision / Eye pain / Eye discharge / Eye   redness / Photophobia    Cardiovascular    [-] Chest pain / Palpitations / Orthopnea / Claudication / Leg swelling /   PND    Respiratory    [+] Shortness of breath     [-] Cough / Hemoptysis / Sputum production / Wheezing     Comments: CHRONIC STABLE SHORTNESS OF BREATH.   Gastrointestinal    [+] Diarrhea     [-] Heartburn / Nausea / Abdominal pain / Constipation / Blood in stool /   Melena / Vomiting     Comments: CHRONIC STABLE DIARRHEA 1-3 TIMES PER DAY.   Genitourinary    [-] Dysuria / Urgency / Frequency / Hematuria / Flank pain    Musculoskeletal    [+] Myalgias     [-] Neck pain / Back pain / Joint pain / Falls     Comments: TWO WEEKS HISTORY OF RIGHT SUB-SCAPULAR PAIN 2/10 CONSTANT WHEN    TOUCHED OTHERWISE NEGLIGIBLE.   Endo/Heme/Aller    [+] Easy bruising/bleeding     [-] Polydipsia / Environmental allergies     Comments: CHRONIC STABLE EASY BRUISING ON BLOOD THINNERS.   Neurological    [+] Tingling     [-] Tremor / Sensory change / Seizures / Focal weakness / Speech change /   Dizziness / Loss of consciousness     Comments: CHRONIC STABLE TINGLING AND NUMBNESS OF FINGERS.   Psychological    [-] Depression / Insomnia / Memory loss / Hallucinations /   Nervous/Anxious / Substance abuse / Suicidal ideas    Edited by:    Maeola Sarah Wed Jun 27, 2020 12:03 PM   I have reviewed, confirmed, and made changes as appropriate.    PAST MEDICAL HISTORY:  Past Medical History:   Diagnosis Date    Cancer of sigmoid colon 09/10/2019    Diabetes     Hypertension     Pulmonary embolism on left 11/16/2019     PAST SURGICAL HISTORY:  Past Surgical History:   Procedure Laterality Date    APPENDECTOMY Right 1942    Reversal of diverting ileostomy      TOTAL COLECTOMY N/A 09/10/2019     BLOOD CLOTS HISTORY: Left pulmonary embolism 11/16/2019.  PREVIOUS CHEMOTHERAPY: As noted above.    FAMILY HISTORY:  History reviewed. No pertinent family history.    SOCIAL HISTORY:  Social History     Social History Narrative    Patient is widowed and has 4 children.  Patient worked as a Conservation officer, historic buildings and denies any exposures to chemicals or radiation.  Patient denies smoking on a regular basis.  Patient denies regular alcohol use.    Patient denies use of chewing tobacco or illicit drugs.     ALLERGIES:  Allergies   Allergen Reactions    5 Fu [Fluorouracil] Other (See Comments)     Chest pain    Sulfa Antibiotics Other (See Comments)     unknown reaction  MEDICATIONS:  Current Outpatient Medications   Medication Sig    prochlorperazine (COMPAZINE) 10 MG tablet Take 1 tablet (10 mg total) by mouth every 6 hours as needed (nausea)    cholestyramine (QUESTRAN) 4 g packet cholestyramine (with sugar) 4  gram powder for susp in a packet   takes once q 2 days    cholecalciferol (VITAMIN D) 50 MCG (2000 UT) capsule Vitamin D3 50 mcg (2,000 unit) capsule   pt stopped taking    apixaban (ELIQUIS) 5 MG tablet Eliquis 5 mg tablet   Take 1 tablet(s) twice a day by oral route.    aspirin 81 MG EC tablet every 24 hours    ciprofloxacin (CIPRO) 500 MG tablet Cipro 500 mg tablet   Take 1 tablet twice a day by oral route for 10 days.    metFORMIN (GLUCOPHAGE) 500 MG tablet metformin 500 mg tablet   TAKE 1 TABLET BY MOUTH ONCE DAILY    tamsulosin (FLOMAX) 0.4 MG capsule every 24 hours    capecitabine (XELODA) 500 MG tablet Take 4 tablets (2,000 mg total) by mouth 2 times daily for 14 days, followed by 7 days off.   Patient's medications, allergies, past medical, surgical, social and family histories were reviewed with the patient and pertinent facts were updated for our records.    ADVANCE CARE DIRECTIVES:  Has patient (or family) completed any of the following? (select all that apply): Health Care Proxy (HCP)  HCP Name: EVERT WENRICH HCP Phone Number: [phone]  HCP available for inclusion in the chart?: No  DNR:  No.     VITAL SIGNS & PERFORMANCE STATUS:  ECOG Performance Status: 0- Fully active, able to carry on all pre-disease performance without restriction   Body mass index is 28.6 kg/m. Body surface area is 2.02 meters squared.  Wt Readings from Last 3 Encounters:   06/27/20 85.1 kg (187 lb 9.8 oz)   06/06/20 86 kg (189 lb 9.5 oz)   05/30/20 85.7 kg (188 lb 15 oz)     Vital Signs    06/27/20 1139   BP: 132/63   Pulse: 80   Resp: 16   Temp: 36.4 C (97.6 F)   Weight: 85.1 kg (187 lb 9.8 oz)   Height: 172.5 cm (5' 7.91")   PainSc:   2   PainLoc: Back      PHYSICAL EXAM:   The following physical elements are noted:  WEIGHT and vital signs are as noted above.  APPEARANCE: Pleasant, well developed, well nourished, in no acute distress.  NEUROLOGICAL: Alert & oriented x 3.  PSYCHIATRIC: Mood and affect are  appropriate.  HEENT: Oral exam deferred due to mask for COVID-19 patients.  LYMPHATICS: No palpable adenopathy in the cervical, supraclavicular, infraclavicular, axillary areas.  PULMONARY: Lungs are clear to auscultation bilaterally with no crackles, wheezes or rhonchi.  CARDIOVASCULAR: Regular rate and rhythm.   GASTROINTESTINAL: Abdomen is soft, non tender. No palpable hepatosplenomegaly.  MUSCULOSKELETAL: Extremities show no edema.  No tenderness right subscapular area.  Previously-no tenderness thoracolumbar spine.  SKIN: No rash noted in the right subscapular area.    DATA REVIEWED:  -BONE SCAN on 06/08/20 showed no focal increased uptake of the radiotracer is seen in the skeleton to suggest any bony metastasis.   -MOLECULAR TESTING ON SIGMOID COLON specimen from 09/10/2019, by [facility] was positive for K-ras pathogenic/p.g12V, negative for ERBB2/HER-2/neu, and other cancer type relevant biomarkers including MSI (stable), PD-L1 (0%),NTRK1/2/3, TMB low (3 mut/mb), BRAF, NRAS, PIK3CA.  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    -Stool cultures on 06/14/2020 were negative.  It appears C. difficile was not done.        Lab results: 06/26/20  0931 06/11/20  1012 05/30/20  1205   WBC 4.7* 5.4 5.8   Hemoglobin 13.2 12.6 12.9   Hematocrit 39.0* 37.7* 38.0*   RBC 3.74* 3.52* 3.65*   Platelets 225 189 178   Neut # K/uL 2.3 3.0 3.5   Lymph # K/uL 1.5 1.3 1.4   Mono # K/uL 0.5 0.5 0.6   Eos # K/uL 0.3 0.6* 0.3   Baso # K/uL 0.1 0.1 0.0   Seg Neut % 49.5 55.5 60.8*   Lymphocyte % 31.8 24.1* 24.0*   Monocyte % 10.7* 9.2 9.9*   Eosinophil % 6.9* 10.1* 4.3   Basophil % 1.1* 0.9 0.7         Lab results: 06/26/20  0931 06/11/20  1012 05/30/20  1205 05/22/20  1600 04/30/20  0700 04/02/20  0700   MCV 104.3* 107.1* 104.1* 109.1* 110.0 109.0         Lab results: 06/26/20  0931 06/11/20  1012 05/30/20  1205   Sodium 140 138 139   Potassium 4.1 4.0 3.8   Chloride 106 104 106   CO2 '26 26 26   ' UN '13  14 16   ' Creatinine 0.9 0.8 0.9   GFR,Caucasian 80.79 92.78 80.99   GFR,Black 97.75 112.26 98.00   Glucose 109* 165* 162*   Calcium 9.2 9.4 9.1   Total Protein 7.1 7.1 7.0   Albumin 4.2 3.7 3.7   ALT '27 16 16   ' AST 33 27 28   Alk Phos 82 82 80   Bilirubin,Total 0.3 0.3 0.3         Lab results: 05/22/20  1600 04/03/20  0700   Iron 50 100   Transferrin Saturation 15 28   Ferritin 139.6 195.00   TIBC  --  263   Vitamin B12 273  --    Folate 10.8  --          Lab results: 05/22/20  1600   Retic % 2.57*          Lab results: 06/26/20  0933   Protein,UR 12.0         Lab results: 06/26/20  0931 06/11/20  1012 05/30/20  1347 05/22/20  1600 05/01/20  0700 04/30/20  0700   CEA 33.8* 38.5* 23.1* 15.3* 6.8 6.8         Lab results: 05/22/20  1600   COVID-19 Source Nasopharyngyl   COVID-19 PCR NEG       RADIOLOGY & SCREENING:   CT CAP: 04/26/2020, 05/28/20   PET/CT 05/09/2020   Bone scan: 06/08/20   Colonoscopy:  05/07/2020   Bone Density:  Never.    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  -S/P TOTAL ABDOMINAL COLECTOMY with ileal proctostomy and DIVERTING LOOP ILEOSTOMY on 09/12/2019 with METASTASIS to 29 of 30 lymph nodes, with extracapsular invasion, vascular invasion and POSITIVE distal and radial margins.  -S/P 12 cycles of adjuvant chemotherapy (2 cycles of FOLFOX, 10 cycles CAPEOX) from 10/23/2021 04/30/2020.  -Rising CEA and possible MESENTERIC ADENOPATHY and MASS AT ANASTOMOTIC SITE noted on CT abdomen 04/27/2019: Stable/improved  on CT 05/28/2020.  -PET/CT 05/09/2020 with low-level uptake in the pelvic lymph nodes and anastomotic site.  -COLONOSCOPY 05/07/2020 negative.  -Findings were reviewed with the patient and his companion.  -Once again we discussed the status of his disease and concern with treating rising CEA,  without definite evidence of progression on CT scans.  -Lengthy discussion was held with the patient and his companion regarding treatment options for the concern for progression/recurrence based on CEA, but no definite evidence of measurable disease based on scans.    -Further options would include close surveillance, continuing current treatment with capecitabine with irinotecan and adding Avastin, versus clinical trials and evaluation at a tertiary care center, for resectability  -Patient is very anxious and would like to proceed with capecitabine and irinotecan and Avastin.  Since he did not take the capecitabine with the last cycle, as advised we will hold off on adding Avastin at this time.  We will add Avastin with the next cycle.  -Patient will proceed with the 2nd cycle of XELIRI on 07/02/2020.  -It appears that the patient did not take capecitabine with his last cycle and CEA did drop with irinotecan alone.  -Dosing schedule of capecitabine and importance of taking medications per instructions was reviewed with them extensively.  Patient was advised to discontinue capecitabine at this time and start again with day one of the second cycle on 07/02/2020.  Instructions for taking this for 2 weeks on 1 week off, were reviewed.  -Risks and benefits of chemotherapy were reviewed again and importance of calling us for any change in number and consistency of his baseline stools was reviewed with him, at length.  -We reviewed the risks and benefits of Avastin including the risk for infusional reaction, hypertension, proteinuria, thrombosis, bleeding and rarely perforation.   -Importance of going to the ER for any severe abdominal pain was reviewed.  -At this time with the role of radiotherapy is not clear and I would recommend consultation with Dr. Wynonia Hazard to review.  Initially patient stated that he would rather wait until he returns back to [place] in 2 months, but then agrees to proceed with  consultation.    -CONSOLIDATION/PNEUMONITIS not on PET/CT on 05/09/20: Improved on CT 05/28/2020.  -Differential diagnosis of consolidation/pneumonitis was reviewed, once again.  Patient appears to be asymptomatic except for baseline shortness of breath, which has not changed recently.    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  -The risk for worsening neuropathy and possible interference with activities, after 10-12 cycles of oxaliplatin was reviewed as well.    -DIARRHEA post reversal ileostomy.  -Stable with Metamucil, Imodium and cholestyramine.    -LEFT PULMONARY EMBOLISM noted on CTA 11/16/19: On Eliquis.    - H/O 5 FU INTOLERANCE: Chest pain with negative cath (report not available).    -H/O WEIGHT LOSS (33 lbs): Resolved.    -ADVANCE CARE DIRECTIVE:   -Advanced Care Planning was reviewed and patient will bring in copy of Health Care Proxy form.    -COVID-19 PANDEMIC.  -Patient was advised to continue COVID-19 precautions.  -Patient has completed COVID-19 vaccination with the [place] 04/29/20.    -Findings and recommendations were reviewed with the patient.  -Opportunity to ask questions was provided. All questions were appropriately answered.  -Patient understands and is in agreement with current plan and recommendations.  -Patient was advised to call for any interim oncology or hematology problems.  I personally spent 65 minutes on the calendar day of the encounter, including pre and  post visit work.        Author: Drenda Freeze, MD, as of: 06/27/2020  at: 1:03 PM   Associate Professor of Clinical Medicine, Hematology/Oncology Division     RECOMMENDATIONS:  -Follow-up 3 weeks.  -CBC, CMP, CEA, urine dipstick 1 day PTA.  -2nd cycle of capecitabine and irinotecan without Avastin on 07/02/2020.  -Capecitabine 2000 mg p.o. within 30 minutes of a meal, twice daily x14 days, starting day 1, followed by 1 week off.  -Please reinforce chemo teaching with dose and schedule of the regimen.  -Refer to Dr. Wynonia Hazard for possible  role of radiotherapy.  Requested Prescriptions     Signed Prescriptions Disp Refills    capecitabine (XELODA) 500 MG tablet 112 tablet 0     Sig: Take 4 tablets (2,000 mg total) by mouth 2 times daily for 14 days, followed by 7 days off.     Care Team            Sofie Rower, [place] PCP - Kandis Mannan, [facility]

## 2020-06-26 ENCOUNTER — Other Ambulatory Visit: Payer: Self-pay | Admitting: Medical Oncology

## 2020-06-26 LAB — COMPREHENSIVE METABOLIC PANEL
ALT: 27 U/L (ref 0–50)
AST: 33 U/L (ref 0–50)
Albumin: 4.2 gm/dL (ref 3.5–5.0)
Alk Phos: 82 U/L (ref 38–126)
Anion Gap: 12 MMOL/L
Bilirubin,Total: 0.3 mg/dL (ref 0.0–1.2)
CO2: 26 mmol/L (ref 21–31)
Calcium: 9.2 mg/dL (ref 8.6–10.2)
Chloride: 106 mmol/L (ref 98–107)
Creatinine: 0.9 mg/dL (ref 0.6–1.3)
GFR,Black: 97.75 mL/min/{1.73_m2} (ref 60–999)
GFR,Caucasian: 80.79 mL/min/{1.73_m2} (ref 60–999)
Glucose: 109 mg/dL — ABNORMAL HIGH (ref 60–99)
Lab: 13 mg/dL (ref 6–20)
Potassium: 4.1 mmol/L (ref 3.4–4.7)
Sodium: 140 mmol/L (ref 133–145)
Total Protein: 7.1 gm/dL (ref 6.0–8.3)

## 2020-06-26 LAB — CBC
Baso # K/uL: 0.1 10*3/uL (ref 0–0.20)
Basophil %: 1.1 % — ABNORMAL HIGH (ref 0–1)
Eos # K/uL: 0.3 10*3/uL (ref 0–0.45)
Eosinophil %: 6.9 % — ABNORMAL HIGH (ref 0–5)
Hematocrit: 39 % — ABNORMAL LOW (ref 42.0–52.0)
Hemoglobin: 13.2 gm/DL (ref 12.0–18.0)
IMM Granulocytes: 0 % (ref 0–1)
Immature Granulocytes Absolute: 0 10*3/uL
Lymph # K/uL: 1.5 10*3/uL (ref 1.0–4.8)
Lymphocyte %: 31.8 % (ref 25.0–45.0)
MCH: 35.3 UUG — ABNORMAL HIGH (ref 29–32)
MCHC: 33.8 g/dl (ref 30–36)
MCV: 104.3 fl — ABNORMAL HIGH (ref 84.5–98.5)
Mono # K/uL: 0.5 10*3/uL (ref 0–0.80)
Monocyte %: 10.7 % — ABNORMAL HIGH (ref 1.7–9.3)
Neut # K/uL: 2.3 10*3/uL (ref 1.8–7.8)
Nucl RBC # K/uL: 0 10*3/uL (ref 0.0–0.0)
Nucl RBC %: 0 % (ref 0–2)
Platelets: 225 10*3/uL (ref 150–400)
RBC: 3.74 M/uL — ABNORMAL LOW (ref 4.5–6.5)
RDW: 14.5 % (ref 11.5–14.5)
Seg Neut %: 49.5 % (ref 40–60)
WBC: 4.7 10*3/uL — ABNORMAL LOW (ref 5.0–11.0)

## 2020-06-26 LAB — PROTEIN, URINE: Protein,UR: 12 mg/dL

## 2020-06-26 NOTE — Progress Notes (Signed)
Patient presents to [facility] infusion today for a port draw per MD order- vitals stable- left chest mediport accessed by writer, using sterile technique- brisk blood return noted- waste obtained- labs collected and sent to [facility] lab- port flushed with NS and Heparin- port de-accessed- band aid applied- patient tolerated procedure well- patient aware of next appt- discharged ambulatory in stable condition.

## 2020-06-27 ENCOUNTER — Other Ambulatory Visit: Payer: Self-pay | Admitting: Pharmacist Clinician (PhC)/ Clinical Pharmacy Specialist

## 2020-06-27 ENCOUNTER — Ambulatory Visit: Payer: Medicare (Managed Care) | Admitting: Medical Oncology

## 2020-06-27 ENCOUNTER — Other Ambulatory Visit: Payer: Self-pay

## 2020-06-27 ENCOUNTER — Encounter: Payer: Self-pay | Admitting: Medical Oncology

## 2020-06-27 VITALS — BP 132/63 | HR 80 | Temp 97.6°F | Resp 16 | Ht 67.91 in | Wt 187.6 lb

## 2020-06-27 DIAGNOSIS — C187 Malignant neoplasm of sigmoid colon: Secondary | ICD-10-CM

## 2020-06-27 LAB — CEA: CEA: 33.8 ng/mL — AB (ref 0.0–4.7)

## 2020-06-27 MED ORDER — CAPECITABINE 500 MG PO TABS *I*
1000.0000 mg/m2 | ORAL_TABLET | Freq: Two times a day (BID) | ORAL | 0 refills | Status: DC
Start: 2020-06-27 — End: 2020-07-23
  Filled 2020-06-27: qty 112, 21d supply, fill #0

## 2020-06-27 NOTE — Patient Instructions (Addendum)
07/02/20:  Dr. Ronnell Guadalajara appointment @ 8:30 am      Treatment @ 12:30pm @ [facility]  07/17/20:  Port draw @ [place] @ 9:30 am  07/18/20:  Dr. Berton Lan @ [place] @ 1:00 pm      -Capecitabine 2000 mg p.o. within 30 minutes of a meal, twice daily x14 days, starting day 1, followed by 1 week off. (see attached calendar)    Our office will call with Dr. Wynonia Hazard appointment for possible radiotherapy.       Your Provider Today is Drenda Freeze MD  Contact Information for the [facility] [place] is [phone]        TREATMENT PLAN: capecitabine, irinotecan, and avastin       In an effort to provide you with the best continuity of care, please contact our office at [phone] if you are admitted to a hospital.    If you are in need of any Social Work services, including but not limited to:   Statistician supply   Home care services    Or, if you have any other questions, concerns, or needs, please contact [place] Social Worker, [place], [place].S.W at [phone]               August 2021      Sunday Monday Tuesday Wednesday Thursday Friday Saturday   1     2     3  Portdraw/urine@9:30AM   4  Dr. Soni @1130   5     6     7       8     9  Dr. Gregory Hare appointment @ 8:30 am         10     11     12     13        15     16  Labs/Treatment Due@1100    Capecitabine AM  Capecitabine PM   17  Capecitabine AM  Capecitabine PM   18  Capecitabine AM  Capecitabine PM   19  Capecitabine AM  Capecitabine PM   20  Capecitabine AM  Capecitabine PM   21  Capecitabine AM  Capecitabine PM     22   Capecitabine AM  Capecitabine PM   23    Capecitabine AM  Capecitabine PM         24    Capecitabine AM  Capecitabine PM    Portdraw/Urine@9:30 am           25    Capecitabine AM  Capecitabine PM        Dr. Soni@1:00 pm             26    Capecitabine AM  Capecitabine PM           27    Capecitabine AM  Capecitabine PM           28    Capecitabine AM  Capecitabine PM             29   Capecitabine AM  Capecitabine PM    30    OFF 31    OFF

## 2020-06-28 ENCOUNTER — Other Ambulatory Visit: Payer: Self-pay | Admitting: Medical Oncology

## 2020-06-28 ENCOUNTER — Other Ambulatory Visit: Payer: Self-pay

## 2020-06-28 NOTE — Patient Instructions (Signed)
07/02/20:  Dr. Ronnell Guadalajara appointment @ 8:30 am      Treatment @ 12:30pm @ [facility]  07/17/20:  Port draw @ [place] @ 9:30 am  07/18/20:  Dr. Berton Lan @ [place] @ 1:00 pm      -Capecitabine 2000 mg (4 tablets) by mouth within 30 minutes of a meal, twice daily x14 days, starting day 1, followed by 1 week off. (see attached calendar)       Your Provider is Drenda Freeze MD  Contact Information for the [facility] at [place] is [phone]        TREATMENT PLAN: capecitabine, irinotecan, and avastin       In an effort to provide you with the best continuity of care, please contact our office at [phone] if you are admitted to a hospital.    If you are in need of any Social Work services, including but not limited to:   Statistician supply   Home care services    Or, if you have any other questions, concerns, or needs, please contact [place] Social Worker, [place], [place].S.W at [phone]             August 2021      Sunday Monday Tuesday Wednesday Thursday Friday Saturday   1     2     3     4     5     6     7       8     9  Dr. Gregory Hare appointment @ 8:30 am    Treatment Due@12:30PM    Capecitabine AM  Capecitabine PM 10    Capecitabine AM  Capecitabine PM 11    Capecitabine AM  Capecitabine PM 12    Capecitabine AM  Capecitabine PM 13    Capecitabine AM  Capecitabine PM 14    Capecitabine AM  Capecitabine PM   15  Capecitabine AM  Capecitabine PM   16  Capecitabine AM  Capecitabine PM   17  Capecitabine AM  Capecitabine PM   18  Capecitabine AM  Capecitabine PM   19  Capecitabine AM  Capecitabine PM   20  Capecitabine AM  Capecitabine PM   21  Capecitabine AM  Capecitabine PM     22    Capecitabine AM  Capecitabine PM   23              OFF 24  Portdraw/Urine@9 :30 am          OFF 25  Dr. Soni@1 :00 pm            OFF 26              OFF 27              OFF 28              OFF   29            OFF 30    Capecitabine/Irinotecan/Bevacizumab@    Capecitabine AM  Capecitabine PM 31

## 2020-06-29 ENCOUNTER — Other Ambulatory Visit: Payer: Self-pay

## 2020-06-29 ENCOUNTER — Telehealth: Payer: Self-pay | Admitting: Oncology

## 2020-06-29 ENCOUNTER — Telehealth: Payer: Self-pay

## 2020-06-29 ENCOUNTER — Other Ambulatory Visit: Payer: Self-pay | Admitting: Oncology

## 2020-06-29 DIAGNOSIS — C779 Secondary and unspecified malignant neoplasm of lymph node, unspecified: Secondary | ICD-10-CM

## 2020-06-29 DIAGNOSIS — R197 Diarrhea, unspecified: Secondary | ICD-10-CM

## 2020-06-29 DIAGNOSIS — J189 Pneumonia, unspecified organism: Secondary | ICD-10-CM

## 2020-06-29 DIAGNOSIS — T451X5A Adverse effect of antineoplastic and immunosuppressive drugs, initial encounter: Secondary | ICD-10-CM

## 2020-06-29 DIAGNOSIS — C772 Secondary and unspecified malignant neoplasm of intra-abdominal lymph nodes: Secondary | ICD-10-CM

## 2020-06-29 DIAGNOSIS — Z86711 Personal history of pulmonary embolism: Secondary | ICD-10-CM

## 2020-06-29 DIAGNOSIS — C801 Malignant (primary) neoplasm, unspecified: Secondary | ICD-10-CM

## 2020-06-29 DIAGNOSIS — G620 Drug-induced polyneuropathy: Secondary | ICD-10-CM

## 2020-06-29 DIAGNOSIS — C187 Malignant neoplasm of sigmoid colon: Secondary | ICD-10-CM

## 2020-06-29 LAB — CLOSTRIDIUM DIFFICILE EIA

## 2020-06-29 LAB — C DIFFICILE TOXIN B PCR

## 2020-06-29 MED ORDER — FIDAXOMICIN 200 MG PO TABS *I*
200.0000 mg | ORAL_TABLET | Freq: Two times a day (BID) | ORAL | 0 refills | Status: DC
Start: 2020-06-29 — End: 2020-07-03

## 2020-06-29 NOTE — Telephone Encounter (Addendum)
Called and dicussed with pt instructions below. Pt verbalizes agreement. Will keep stool log and call on Tuesday with results. No questions or concerns at this time.      ----- Message from Drenda Freeze, MD sent at 06/29/2020 12:56 PM EDT -----  Regarding: FW: Positive C. difficile  Please advise patient of positive C. difficile results and below orders.  Hold chemotherapy on 07/02/2020.    Please advise patient to keep a stool log and call with results on 07/03/2020.    We can proceed with chemotherapy on 07/05/2019 as long as his diarrhea is improved/resolved.  ----- Message -----  From: Ninetta Lights, [place]  Sent: 06/29/2020  10:49 AM EDT  To: Drenda Freeze, MD, Herschel Senegal Learn, LPN  Subject: Positive C. difficile                            Continued diarrhea precautions and fluid maintenance.  Begin Dificid 200mg  bid x 10 days.  Let me know what his pharmacy is so I can send that.  Thanks  ----- Message -----  From: Guadalupe Dawn, LPN  Sent: 06/25/7077  10:38 AM EDT  To: Drenda Freeze, MD, Ninetta Lights, PA

## 2020-06-29 NOTE — Telephone Encounter (Addendum)
Patient informed vl       ----- Message from Ninetta Lights, PA sent at 06/29/2020 10:49 AM EDT -----  Regarding: Positive C. difficile  Continued diarrhea precautions and fluid maintenance.  Begin Dificid 200mg  bid x 10 days.  Let me know what his pharmacy is so I can send that.  Thanks  ----- Message -----  From: Guadalupe Dawn, LPN  Sent: 06/25/5052  10:38 AM EDT  To: Drenda Freeze, MD, Ninetta Lights, PA

## 2020-07-02 MED FILL — Irinotecan HCl Inj 100 MG/5ML (20 MG/ML): INTRAVENOUS | Qty: 20.3 | Status: AC

## 2020-07-03 ENCOUNTER — Other Ambulatory Visit: Payer: Self-pay

## 2020-07-03 ENCOUNTER — Other Ambulatory Visit: Payer: Self-pay | Admitting: Medical Oncology

## 2020-07-03 ENCOUNTER — Telehealth: Payer: Self-pay

## 2020-07-03 MED ORDER — VANCOMYCIN HCL 125 MG PO CAPS *I*
125.0000 mg | ORAL_CAPSULE | Freq: Four times a day (QID) | ORAL | 0 refills | Status: AC
Start: 2020-07-03 — End: 2020-07-13

## 2020-07-03 NOTE — Telephone Encounter (Addendum)
Called and spoke with pt and significant other. Discussed holding treatment, stool log, medications, and BRAT diet. Pt verbalizes agreement. Will call Thursday and Friday with stool log. Will pick up Vanco and start taking ASAP.       ----- Message from Drenda Freeze, MD sent at 07/03/2020  2:32 PM EDT -----  Regarding: RE: Stool log  Yes, absolutely  ----- Message -----  From: Gearlean Alf  Sent: 07/03/2020   1:39 PM EDT  To: Drenda Freeze, MD  Subject: RE: Stool log                                    Should pt hold off on taking Xeloda until day 1 of chemo?   ----- Message -----  From: Drenda Freeze, MD  Sent: 07/03/2020  12:27 PM EDT  To: Gearlean Alf, [facility] Rn  Subject: RE: Stool log                                    -For the diarrhea log, it is important for me to know maximum stools per day, as well as the last 1 to 2 days, in order to get an idea of the severity and if there is any improvement.    -But if he has not started the Dificid, we do not expect an improvement at this time.  Please advise him that he needs an antibiotic right away, since he has an infection and it is critical that he start his antibiotic right away.  Please also advise him regarding the importance of notifying us immediately, if he is unable to obtain prescribed antibiotic/medication in a timely manner.    -Please ask him to start vancomycin 125 mg p.o. 4 times daily for 10 days, ASAP.  -He should not take any Imodium or Lomotil, since he has C. difficile diarrhea.    Please advise him to maintain a stool log and call us with an update on 07/05/2020 and 07/06/2020.  Please reschedule chemotherapy to 07/09/2020.  He will need repeat CBC, CMP on 07/06/2020.    Please make sure he has no fevers, abdominal pain and is able to maintain his oral intake, including hydration and electrolytes.  Please advise him to follow the BRAT diet.    Thanks.    ----- Message -----  From: Gearlean Alf  Sent: 07/03/2020   9:33 AM EDT  To: Drenda Freeze,  MD  Subject: Stool log                                        Had the pleasure of going over stool log this morning with pt. Between Friday at noon and early this morning pt had 31 bouts of "Diarrhea." 2 out of those 31 stools were semi formed. Only took Metamucil once on Sunday at 730am. States that it slowed down a little. Discussed Imodium, pt states that Imodium does not work for him. Also dicussed if pt was taking medication that Ronalee Belts had sent in for him on Friday. Pt at first states that pharmacy was not open when he when to get it all weekend. Talked about that he needs to go get it and start taking it. Then pt states" Oh I forgot, the  medication was going to be 1100 dollars, and I am not paying that." Pt states that he called one of the offices and left a message. I checked the phone yesterday all day and we did not have any messages from him. How would you like to proceed with pt and possible treatment tomorrow? Thank you.

## 2020-07-03 NOTE — Telephone Encounter (Signed)
Drenda Freeze, MD  Sampson Goon, Cyril Mourning  Cc: P [facility] Rn  -For the diarrhea log, it is important for me to know maximum stools per day, as well as the last 1 to 2 days, in order to get an idea of the severity and if there is any improvement.     -But if he has not started the Dificid, we do not expect an improvement at this time. Please advise him that he needs an antibiotic right away, since he has an infection and it is critical that he start his antibiotic right away. Please also advise him regarding the importance of notifying us immediately, if he is unable to obtain prescribed antibiotic/medication in a timely manner.     -Please ask him to start vancomycin 125 mg p.o. 4 times daily for 10 days, ASAP.   -He should not take any Imodium or Lomotil, since he has C. difficile diarrhea.     Please advise him to maintain a stool log and call us with an update on 07/05/2020 and 07/06/2020.   Please reschedule chemotherapy to 07/09/2020.   He will need repeat CBC, CMP on 07/06/2020.     Please make sure he has no fevers, abdominal pain and is able to maintain his oral intake, including hydration and electrolytes.   Please advise him to follow the BRAT diet.     Thanks.          Previous Messages      ----- Message -----   From: Gearlean Alf   Sent: 07/03/2020  9:33 AM EDT   To: Drenda Freeze, MD   Subject: Stool log                       Had the pleasure of going over stool log this morning with pt. Between Friday at noon and early this morning pt had 31 bouts of "Diarrhea." 2 out of those 31 stools were semi formed. Only took Metamucil once on Sunday at 730am. States that it slowed down a little. Discussed Imodium, pt states that Imodium does not work for him. Also dicussed if pt was taking medication that Ronalee Belts had sent in for him on Friday. Pt at first states that pharmacy was not open when he when to get it all weekend. Talked about that he needs to go get it and start taking it. Then pt  states" Oh I forgot, the medication was going to be 1100 dollars, and I am not paying that." Pt states that he called one of the offices and left a message. I checked the phone yesterday all day and we did not have any messages from him. How would you like to proceed with pt and possible treatment tomorrow? Thank you.

## 2020-07-05 ENCOUNTER — Telehealth: Payer: Self-pay

## 2020-07-05 NOTE — Telephone Encounter (Signed)
Pt calling and giving an update on and his stool log. On 8/10 he had 3 bouts of Diarrhea, on 8/11 he had 5 bouts but did start the Vanco at 1 pm and on Today so far he has had 3 bouts. States that he is following the [employer] and feeling better. No abdominal pain or fevers noted. Is able to maintain oral intake. Will call us again tomorrow with another update.

## 2020-07-06 ENCOUNTER — Other Ambulatory Visit: Payer: Self-pay

## 2020-07-06 ENCOUNTER — Telehealth: Payer: Self-pay

## 2020-07-06 ENCOUNTER — Other Ambulatory Visit: Payer: Self-pay | Admitting: Medical Oncology

## 2020-07-06 LAB — CBC
Baso # K/uL: 0.1 10*3/uL (ref 0–0.20)
Basophil %: 1.1 % — ABNORMAL HIGH (ref 0–1)
Eos # K/uL: 0.4 10*3/uL (ref 0–0.45)
Eosinophil %: 6 % — ABNORMAL HIGH (ref 0–5)
Hematocrit: 37.8 % — ABNORMAL LOW (ref 42.0–52.0)
Hemoglobin: 12.9 gm/DL (ref 12.0–18.0)
IMM Granulocytes: 0.3 % (ref 0–1)
Immature Granulocytes Absolute: 0.02 10*3/uL
Lymph # K/uL: 1.7 10*3/uL (ref 1.0–4.8)
Lymphocyte %: 27.9 % (ref 25.0–45.0)
MCH: 35.3 UUG — ABNORMAL HIGH (ref 29–32)
MCHC: 34.1 g/dl (ref 30–36)
MCV: 103.6 fl — ABNORMAL HIGH (ref 84.5–98.5)
Mono # K/uL: 0.6 10*3/uL (ref 0–0.80)
Monocyte %: 10.3 % — ABNORMAL HIGH (ref 1.7–9.3)
Neut # K/uL: 3.4 10*3/uL (ref 1.8–7.8)
Nucl RBC # K/uL: 0 10*3/uL (ref 0.0–0.0)
Nucl RBC %: 0 % (ref 0–2)
Platelets: 176 10*3/uL (ref 150–400)
RBC: 3.65 M/uL — ABNORMAL LOW (ref 4.5–6.5)
RDW: 14.6 % — ABNORMAL HIGH (ref 11.5–14.5)
Seg Neut %: 54.4 % (ref 40–60)
WBC: 6.2 10*3/uL (ref 5.0–11.0)

## 2020-07-06 LAB — COMPREHENSIVE METABOLIC PANEL
ALT: 22 U/L (ref 0–50)
AST: 30 U/L (ref 0–50)
Albumin: 4 gm/dL (ref 3.5–5.0)
Alk Phos: 77 U/L (ref 38–126)
Anion Gap: 18 MMOL/L
Bilirubin,Total: 0.3 mg/dL (ref 0.0–1.2)
CO2: 23 mmol/L (ref 21–31)
Calcium: 9.4 mg/dL (ref 8.6–10.2)
Chloride: 102 mmol/L (ref 98–107)
Creatinine: 0.9 mg/dL (ref 0.6–1.3)
GFR,Black: 97.75 mL/min/{1.73_m2} (ref 60–999)
GFR,Caucasian: 80.79 mL/min/{1.73_m2} (ref 60–999)
Glucose: 112 mg/dL — ABNORMAL HIGH (ref 60–99)
Lab: 12 mg/dL (ref 6–20)
Potassium: 3.8 mmol/L (ref 3.4–4.7)
Sodium: 139 mmol/L (ref 133–145)
Total Protein: 7 gm/dL (ref 6.0–8.3)

## 2020-07-06 LAB — MAGNESIUM: Magnesium: 1.8 mg/dL (ref 1.6–2.5)

## 2020-07-06 NOTE — Telephone Encounter (Addendum)
Stools are back to baseline. Will continue Vanco and stool log.       ----- Message from Drenda Freeze, MD sent at 07/06/2020  1:32 PM EDT -----  Regarding: RE: Stool log  Are the stools watery/ soft and are they back to his baseline?    Please advise him to continue to keep stool log and complete 10 days of vanco.    May proceed with chemo on Monday, if stools back to baseline and labs wnl.    Perhaps, you can check with Almyra Free on Monday, if any questions.  ----- Message -----  From: Gearlean Alf  Sent: 07/06/2020  11:28 AM EDT  To: Drenda Freeze, MD, Fransico Him Presutti, PA  Subject: RE: Stool log                                    Giving another update about pt and stool log. As I documented yesterday 07/05/20 he had already had 3 bouts of Diarrhea in the AM. Throughout the rest of 07/05/20 had 2 more bouts. Today he has already had 5 bouts of Diarrhea. He has not changed his routine at all. Still taking the Vanco 4 times a day and continuing the [employer]. No abdominal pain or fevers noted. Is still maintaining oral hydration and intake. Is to have treatment on Monday pending labs/Diarrhea status.

## 2020-07-06 NOTE — Progress Notes (Signed)
Patient presents to [facility] infusion today for a port draw per MD order- vitals stable- left chest mediport accessed by [facility] RN, using sterile technique- brisk blood return noted- waste obtained- labs collected and sent to [facility] lab- port flushed with NS and Heparin- port de-accessed- band aid applied- patient tolerated procedure well- patient aware of next appt- discharged ambulatory in stable condition.

## 2020-07-09 MED FILL — Irinotecan HCl Inj 100 MG/5ML (20 MG/ML): INTRAVENOUS | Qty: 20.3 | Status: AC

## 2020-07-09 NOTE — Progress Notes (Signed)
Patient arrived to infusion center for his treatment. Ambulatory to chair 5. Vitals- BP 136/66, 02 94%, 18 resp, 98.3 temp, 74 HR.  Orders/consent/hand off/dosages reviewed. Port accessed with sterile technique with blood return. Pt treatment was put on hold last week due to C-Diff positive and diarrhea. Pt states that his stools are back to baseline. Confirmed home anti-diarrhea and anti-antiemetic home regimens. Pre meds given. C1D2 of Irinotecan 406mg  hooked up and infused over approx. 90 minutes per order. During the treatment did get up and go to the bathroom 3 times. Pt states that he is back to his baseline but it comes in waves. Patient tolerated well. Line flushed. Port flushed with saline and heparin per protocol. De-accessed port. Spot bandaid applied. Patient aware of next scheduled appointment. Ambulatory for discharge in stable condition accompanied by wife.

## 2020-07-17 ENCOUNTER — Other Ambulatory Visit: Payer: Self-pay | Admitting: Medical Oncology

## 2020-07-17 LAB — CBC
Baso # K/uL: 0 10*3/uL (ref 0–0.20)
Basophil %: 0.7 % (ref 0–1)
Eos # K/uL: 0.2 10*3/uL (ref 0–0.45)
Eosinophil %: 4.7 % (ref 0–5)
Hematocrit: 38.3 % — ABNORMAL LOW (ref 42.0–52.0)
Hemoglobin: 13 gm/DL (ref 12.0–18.0)
IMM Granulocytes: 0.7 % (ref 0–1)
Immature Granulocytes Absolute: 0.03 10*3/uL
Lymph # K/uL: 1.4 10*3/uL (ref 1.0–4.8)
Lymphocyte %: 32.9 % (ref 25.0–45.0)
MCH: 34.7 UUG — ABNORMAL HIGH (ref 29–32)
MCHC: 33.9 g/dl (ref 30–36)
MCV: 102.1 fl — ABNORMAL HIGH (ref 84.5–98.5)
Mono # K/uL: 0.3 10*3/uL (ref 0–0.80)
Monocyte %: 7.7 % (ref 1.7–9.3)
Neut # K/uL: 2.3 10*3/uL (ref 1.8–7.8)
Nucl RBC # K/uL: 0 10*3/uL (ref 0.0–0.0)
Nucl RBC %: 0 % (ref 0–2)
Platelets: 181 10*3/uL (ref 150–400)
RBC: 3.75 M/uL — ABNORMAL LOW (ref 4.5–6.5)
RDW: 14.1 % (ref 11.5–14.5)
Seg Neut %: 53.3 % (ref 40–60)
WBC: 4.3 10*3/uL — ABNORMAL LOW (ref 5.0–11.0)

## 2020-07-17 LAB — COMPREHENSIVE METABOLIC PANEL
ALT: 24 U/L (ref 0–50)
AST: 28 U/L (ref 0–50)
Albumin: 3.9 gm/dL (ref 3.5–5.0)
Alk Phos: 72 U/L (ref 38–126)
Anion Gap: 13 MMOL/L
Bilirubin,Total: 0.5 mg/dL (ref 0.0–1.2)
CO2: 26 mmol/L (ref 21–31)
Calcium: 9.6 mg/dL (ref 8.6–10.2)
Chloride: 105 mmol/L (ref 98–107)
Creatinine: 1 mg/dL (ref 0.6–1.3)
GFR,Black: 86.56 mL/min/{1.73_m2} (ref 60–999)
GFR,Caucasian: 71.54 mL/min/{1.73_m2} (ref 60–999)
Glucose: 144 mg/dL — ABNORMAL HIGH (ref 60–99)
Lab: 14 mg/dL (ref 6–20)
Potassium: 4 mmol/L (ref 3.4–4.7)
Sodium: 140 mmol/L (ref 133–145)
Total Protein: 6.6 gm/dL (ref 6.0–8.3)

## 2020-07-17 LAB — PROTEIN, URINE: Protein,UR: 13 mg/dL

## 2020-07-17 NOTE — Patient Instructions (Addendum)
07/27/20: Port draw@[facility] @10 :00am   07/31/20: treatment@[facility]  @10 :00am  08/07/20:Port draw @ [place] @ 11:00 am  08/08/20:  Dr. Dorita Fray @ [place] @ 12:40 pm  08/17/20:PORT Draw@[facility] @10 :00AM  08/20/20:Treatment@[facility] @10 :00AM    -Capecitabine 2000 mg p.o. within 30 minutes of a meal, twice daily x14 days, starting day 1, followed by 1 week off. (see attached calendar)         Your Provider Today is Drenda Freeze MD  Contact Information for the [facility] at [place] is [phone]        TREATMENT PLAN: capecitabine, irinotecan, and avastin       In an effort to provide you with the best continuity of care, please contact our office at [phone] if you are admitted to a hospital.    If you are in need of any Social Work services, including but not limited to:   Statistician supply   Home care services    Or, if you have any other questions, concerns, or needs, please contact [place] Social Worker, [place], [place].S.W at [phone]               August 2021      Sunday Monday Tuesday Wednesday Thursday Friday Saturday   1     2     3  Portdraw/urine@9:30AM   4  Dr. Soni @1130   5     6     7       8     9  Dr. Gregory Hare appointment @ 8:30 am 10     11     12     13        15     16  Labs/Treatment Due@1100    Capecitabine AM  Capecitabine PM   17  Capecitabine AM  Capecitabine PM   18  Capecitabine AM  Capecitabine PM   19  Capecitabine AM  Capecitabine PM   20  Capecitabine AM  Capecitabine PM   21  Capecitabine AM  Capecitabine PM     22   Capecitabine AM  Capecitabine PM   23    Capecitabine AM  Capecitabine PM   24    Capecitabine AM  Capecitabine PM    Portdraw/Urine@9:30 am           25    Capecitabine AM  Capecitabine PM        Dr. Soni@1:00 pm             26    Capecitabine AM  Capecitabine PM           27    Capecitabine AM  Capecitabine PM           28    Capecitabine AM  Capecitabine PM             29   Capecitabine AM  Capecitabine PM   30    OFF 31    OFF                                     September 2021      Sunday Monday Tuesday Wednesday Thursday Friday Saturday                   1  OFF   2  OFF   3  OFF  Portdraw@10 :00AM CBC,CMP,UA 4  OFF  5  OFF   6      Capecitabine AM  Capecitabine PM     7  Treatment@10AM     Capecitabine AM  Capecitabine PM 8      Capecitabine AM  Capecitabine PM 9      Capecitabine AM  Capecitabine PM 10      Capecitabine AM  Capecitabine PM 11      Capecitabine AM  Capecitabine PM   12    Capecitabine AM  Capecitabine PM   13    Capecitabine AM  Capecitabine PM 14  Port draw @11 :00AM CBC,CMP,UA, CEA@ [facility] @ 11:00 am Capecitabine AM  Capecitabine PM 15  Dr. Dorita Fray @ 12:40 pm  Capecitabine AM  Capecitabine PM 16    Capecitabine AM  Capecitabine PM 17    Capecitabine AM  Capecitabine PM 18    Capecitabine AM  Capecitabine PM   19  Capecitabine AM  Capecitabine PM   20    OFF   21    OFF 22    OFF 23    OFF 24  Port draw @10 :00am  OFF 25    OFF   26    OFF   27  Treatment@10 :00am    Capecitabine AM  Capecitabine PM 28      Capecitabine AM  Capecitabine PM 29      Capecitabine AM  Capecitabine PM 30      Capecitabine AM  Capecitabine PM                       October 2021      Sunday Monday Tuesday Wednesday Thursday Friday Saturday                            1  Capecitabine AM  Capecitabine PM   2  Capecitabine AM  Capecitabine PM     3  Capecitabine AM  Capecitabine PM   4  Capecitabine AM  Capecitabine PM   5  Capecitabine AM  Capecitabine PM   6  Capecitabine AM  Capecitabine PM   7  Capecitabine AM  Capecitabine PM   8  Capecitabine AM  Capecitabine PM   9  Capecitabine AM  Capecitabine PM     10  Capecitabine AM  Capecitabine PM   11    OFF 12    OFF 13    OFF 14    OFF 15    OFF 16    OFF   17  OFF   18     19     20     21     22     23       24     25     26     27     28     29     30       31

## 2020-07-17 NOTE — Progress Notes (Signed)
PATIENT NAME: Carlos Woods (MRN: [id number])  DATE OF SERVICE: 07/18/2020     REASON FOR CONSULATION:  1. 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  2. Prior to the 2nd cycle of  XELIRI and Avastin.     CHIEF COMPLAINT: I still have diarrhea.      ONCOLOGY HISTORY / HISTORY OF PRESENT ILLNESS:  Oncology History   Cancer of sigmoid colon   09/10/2019 Genetic Testing    -MOLECULAR TESTING ON SIGMOID COLON specimen from 09/10/2019, by [employer] was positive for K-ras pathogenic/p.g12V, negative for ERBB2/HER-2/neu, and other cancer type relevant biomarkers including MSI (stable), PD-L1 (0%),NTRK1/2/3, TMB low (3 mut/mb), BRAF, NRAS, PIK3CA.  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  -S/P TOTAL ABDOMINAL COLECTOMY with ileal proctostomy and DIVERTING LOOP ILEOSTOMY on 09/12/2019 with METASTASIS to 29 of 30 lymph nodes, with extracapsular invasion, vascular invasion and POSITIVE distal and radial margins.  -S/P 12 cycles of ADJUVANT CHEMOTHERAPY (2 cycles of FOLFOX, 10 cycles CAPEOX) from 10/23/2020 to 04/30/2020.  -Patient with self-medication with capecitabine alone on 05/21/2020 and 05/28/2020.  -Patient with stage IIIC colon cancer who has been receiving adjuvant chemotherapy in [place] (Dr. Warner Mccreedy) and is in the [place] area until October and presents for further treatment and follow-up.  -History is summarized from documentation received from Dr. Frederico Hamman:  -Patient apparently presented with SIGMOID COLON OBSTRUCTION and contained PERFORATION of the colon.  -CT ABDOMEN PELVIS on 09/10/2019 reportedly  showed colonic diverticulosis with acute diverticulitis sigmoid colon, bowel obstruction, likely secondary to sigmoid colon mass.  -S/P TOTAL ABDOMINAL COLECTOMY with ileal proctostomy and DIVERTING LOOP ILEOSTOMY on 09/12/2019.  -Pathology from 09/10/2019 showed pT4 pN2b, grade 3, INVASIVE MUCINOUS AND SIGNET RING ADENOCARCINOMA arising in the sigmoid diverticulum with perforation.  Tumor invaded through the bowel wall into the pericolic fat.  Radial and distal MARGINS WERE POSITIVE for tumor.  29 of 30 lymph nodes were positive for metastatic mucinous adenocarcinoma with extracapsular invasion and vascular invasion.  Diffuse diverticulosis and bowel obstruction noted.  -Patient was seen by Dr. Frederico Hamman on 10/12/2019 and was advised staging scans, Guardant 360 and adjuvant treatment with FOLFOX.  -Patient was apparently STARTED ON ADJUVANT FOLFOX on 10/24/2019 and treatment was complicated by recurrent chest pain and shortness of breath, requiring hospitalization and cardiac catheterization after the 2nd cycle of FOLFOX on 10/31/2019.      -CTA CHEST on 11/16/2019, showed a large left-sided PULMONARY EMBOLISM, and patient is currently on Eliquis.  -PET/CT on 11/22/2019 with mild activity in the upper right hilum with SUV 1.8 and activity in the left hilum with SUV 3.4, without evidence of adenopathy or mass.  No evidence of recurrent or new neoplasm noted.  -Patient only recalls 1 episode of chest pain related to PE.    -Patient was apparently SWITCHED TO XELODA on 11/28/2019, after 2 cycles of FOLFOX and has received 12 cycles of chemotherapy with 2 cycles of FOLFOX and 10 cycles of CAPEOX, per phone call to [facility], infusion RN.   -CT CAP on 04/26/2020 compared with 02/02/2020, showed new multifocal peripheral volume loss and fissural  nodularity, multifocal mesenteric fat stranding and soft tissue mass about the sigmoid anastomosis 3 x 2.8 cm extending to the left posterior bladder and 4.4 x 2.4 cm  soft tissue focus extending more superiorly.  Left and central pelvic mesenteric nodules with largest measuring 1.4 x 1.2 cm compared to 1.3 x 1.1 cm and another 1.7 x 1.1 cm versus 1.4 x 0.9 cm.  Development of a 2.4 x 1.0 cm focus of nodularity to the left of the bladder extending to the sigmoid colon to the anterior abdominal wall.  Stable 2.5 x 1.2 cm left external adenopathy.  -Latest available PET/CT on 05/09/2020 compared with CT scan 04/26/2020 and PET/CT 11/22/2019 showed pleural-based area of consolidation within the lungs bilaterally (most prominent posterior LUL), new since 04/26/2020, concerning for pneumonia, secondary changes of pulmonary embolic disease are more and inflammatory process such as cryptogenic organizing pneumonia.  Lymphadenopathy in the pelvis with SUV 2.8 and soft tissue at the anastomotic site with SUV 3.9 are again identified and better seen on CT scan 04/26/2020.  Asymmetric abnormal activity noted right mandible and an area for dental implant with SUV 6.3.  -The patient has been receiving oxaliplatin 85 mg every 2 weeks and received the 1st cycle of Xeloda with oxaliplatin on 12/12/2019 and the 9th cycle on 04/30/2020.     -Per Dr. Lorane Gell note 05/10/2020, patient was apparently treated for tooth infection with antibiotics and tooth extraction and he had a colonoscopy which was negative, including anastomotic site.    -Patient was apparently advised to continue capecitabine 2000 mg twice daily for 7 days on, 7 days off and oxaliplatin 85 mg/m every 2 weeks to a total dose of 150 mg.    -Colonoscopy to mid sigmoid colon on 05/07/20 showed previous end to end ileo clonic anastomosis was seen the the mid sigmoid colon, S/P subtotal colectomy.  -On 10/12/2019, CEA was 24.7, and 4.6 on 01/09/2020, 5.7 on 04/02/2020, and 6.8 on 04/30/2020.      -Patient was first seen at [facility] on 05/22/2020.  -Per discussion with Dr. Warner Mccreedy on 05/22/2020, he indicated that he did review the patient's chart and  stated that he does not recommend further adjuvant chemotherapy, at this time.  He did not think that the patient has any definite evidence of residual disease/metastases and will reach out to the patient and convey this to him.  He also noted that there was no treatment offered for findings of possible pneumonia on recent PET/CT, since patient was asymptomatic.    -We also discussed possible adjuvant radiotherapy and apparently patient has previously refused radiation consultation on a couple occasions.  -Patient self initiated the 10th and 11th cycle of capecitabine alone on 05/13/2020 and 05/27/2020.     10/12/2019 Genetic Testing    -GUARDANT 360 on 10/12/2019 showed K-ras G 12V mutation, MSS, TMB 10.58 mut/MB and was negative for MSI high, K-ras, NRAS, BRAF, ERBB2, NTRK. VUS NOTED WERE CDKN2A, NF1.     05/28/2020 Significant Radiology Findings    -On 10/12/2019, CEA was 24.7, and 4.6 on 01/09/2020.  -Rising CEA noted - 5.7 on 04/02/2020, 6.8 on 04/30/2020, 15.3 on 05/22/2020 and 38.5 on 06/11/2020.  -CT chest, abdomen and pelvis on 05/28/20 compared with PET/CT 05/09/20 and CT CAP on 04/26/20 showed decreasing peripheral patchy groundglass opacities/densities in the lung.  Target lesion in the LUL was 20 x 11 versus 29 x 12 mm and RLL was 13 x 11 versus 24 x 17 mm.  Minimal peripheral groundglass  opacity suggests inflammatory change, interstitial infiltrate, superimposed on fibrotic findings.  No new nodules.  31 x 30 mm spiculated appearing pelvic mass, without change, likely patient's primary neoplasm with adjacent adenopathy noted.  Largest dominant node anterior to the primary lesion is 15 x 14 mm versus 29 x 23 mm.    -On 05/30/20 CEA was 23.1, this was sent to Dr, Frederico Hamman and I did leave a message for him to review further management, per patient request.  -Dr. Frederico Hamman did return my call on 06/01/2020 and we discussed patient status extensively.  We are both in agreement, that the current chemotherapy is likely not working  due to rising CEA since it is nadired in February 2021.  However, we also reviewed the fact that his scans scans do not show any obvious evidence of progression or definite measurable disease.  We discussed options for close observation, since he has completed 12 cycles of adjuvant chemotherapy versus proceeding with second line chemotherapy with capecitabine and irinotecan.  Dr. Frederico Hamman did favor proceeding with capecitabine and irinotecan, in view of rising CEA and patient's anxiety in spite of no definite progression on scans.  -His nurse practitioner, Estill Bamberg, did reach out to our office on 06/01/2020, to communicate Dr. Lorane Gell recommendation of proceeding with capecitabine, irinotecan and Avastin.    -Patient has repeatedly refused another opinion or tertiary cancer center evaluation.  -Per discussion with Dr. Frederico Hamman, capecitabine, irinotecan and Avastin started 06/11/2020.     06/11/2020 -  Chemotherapy    OP GI Colon Capecitabine Irinotecan Bevacizumab  Plan Provider: Drenda Freeze, MD  Treatment goal: Palliative  Line of treatment: [No plan line of treatment]       INTERVAL HISTORY: Mr. Denman George is here for follow-up, accompanied by his wife, was recently treated for C. difficile, but still has mild diarrhea, no fever, abdominal pain, his WBC were within normal range, no other major complaints. Denies headache, visual impairment, denies chest pain, shortness breath, denies nausea, vomiting or diarrhea. Reviewed labs and EMR.     REVIEW of SYSTEMS:  The complete review of system is otherwise negative except as mentioned in interval history.     PAST MEDICAL HISTORY:  Past Medical History:   Diagnosis Date    Cancer of sigmoid colon 09/10/2019    Diabetes     Hypertension     Pulmonary embolism on left 11/16/2019     PAST SURGICAL HISTORY:  Past Surgical History:   Procedure Laterality Date    APPENDECTOMY Right 1942    Reversal of diverting ileostomy      TOTAL COLECTOMY N/A 09/10/2019     BLOOD CLOTS  HISTORY: Left pulmonary embolism 11/16/2019.  PREVIOUS CHEMOTHERAPY: As noted above.    FAMILY HISTORY:  No family history on file.    SOCIAL HISTORY:  Social History     Social History Narrative    Patient is widowed and has 4 children.  Patient worked as a Conservation officer, historic buildings and denies any exposures to chemicals or radiation.  Patient denies smoking on a regular basis.  Patient denies regular alcohol use.    Patient denies use of chewing tobacco or illicit drugs.     ALLERGIES:  Allergies   Allergen Reactions    5 Fu [Fluorouracil] Other (See Comments)     Chest pain    Sulfa Antibiotics Other (See Comments)     unknown reaction      MEDICATIONS:  Current Outpatient Medications   Medication Sig  capecitabine (XELODA) 500 MG tablet Take 4 tablets by mouth 2 times daily for 14 days, followed by 7 days off.    prochlorperazine (COMPAZINE) 10 MG tablet Take 1 tablet (10 mg total) by mouth every 6 hours as needed (nausea)    cholestyramine (QUESTRAN) 4 g packet cholestyramine (with sugar) 4 gram powder for susp in a packet   takes once q 2 days    cholecalciferol (VITAMIN D) 50 MCG (2000 UT) capsule Vitamin D3 50 mcg (2,000 unit) capsule   pt stopped taking    apixaban (ELIQUIS) 5 MG tablet Eliquis 5 mg tablet   Take 1 tablet(s) twice a day by oral route.    aspirin 81 MG EC tablet every 24 hours    metFORMIN (GLUCOPHAGE) 500 MG tablet metformin 500 mg tablet   TAKE 1 TABLET BY MOUTH ONCE DAILY    tamsulosin (FLOMAX) 0.4 MG capsule every 24 hours   Patient's medications, allergies, past medical, surgical, social and family histories were reviewed with the patient and pertinent facts were updated for our records.    ADVANCE CARE DIRECTIVES:  Has patient (or family) completed any of the following? (select all that apply): Health Care Proxy (HCP)  HCP Name: GABERIAL CADA HCP Phone Number: [phone]  HCP available for inclusion in the chart?: No  DNR:  No.     VITAL SIGNS & PERFORMANCE STATUS:       There is no height or weight on file to calculate BMI. There is no height or weight on file to calculate BSA.  Wt Readings from Last 3 Encounters:   06/27/20 85.1 kg (187 lb 9.8 oz)   06/06/20 86 kg (189 lb 9.5 oz)   05/30/20 85.7 kg (188 lb 15 oz)     There were no vitals filed for this visit.     PHYSICAL EXAM:   ECOG: 1   General: Patient appears: in no apparent distress, well developed and well nourished.   Head: Atraumatic and normocephalic.   Eyes: No scleral icterus, and/or pallor. Conjunctiva clear.   Ears, Nose, Mouth & Throat: Neck supple. No lymphadenopathy. Thyroid midline and normal in size.   Neck: No lymphadenopathy present. Neck supple.   Cardiovascular: Heart with regular rate and rhythm. Normal S1 and S2. No gallop. No rubs or clicks. No bruits present.   Chest: Clear no rales/rhonchi. No dullness to percussion. No wheezing. Chest wall expansion normal. No chest wall tenderness. No   stridor.   Abdomen: Soft. Non-tender. Non-distended. No palpable masses. No ascites. No hepatosplenomegaly. Bowel sounds present in all   quadrants.   Hematologic/Lymphatic: No cervical lymphadenopathy. No supraclavicular lymphadenopathy. No axillary lymphadenopathy. No inguinal   lymphadenopathy.   Skin: No rashes. No lesions.   Extremities: No edema. No cyanosis. No clubbing. Discoloration none.   Neurologic: Alert and oriented. Normal speech. sensation intact. No focal sensory or motor deficit.     DATA REVIEWED:        Lab results: 07/17/20  0930 07/06/20  1340 06/26/20  0931   WBC 4.3* 6.2 4.7*   Hemoglobin 13.0 12.9 13.2   Hematocrit 38.3* 37.8* 39.0*   RBC 3.75* 3.65* 3.74*   Platelets 181 176 225   Neut # K/uL 2.3 3.4 2.3   Lymph # K/uL 1.4 1.7 1.5   Mono # K/uL 0.3 0.6 0.5   Eos # K/uL 0.2 0.4 0.3   Baso # K/uL 0.0 0.1 0.1   Seg Neut % 53.3 54.4 49.5   Lymphocyte %  32.9 27.9 31.8   Monocyte % 7.7 10.3* 10.7*   Eosinophil % 4.7 6.0* 6.9*   Basophil % 0.7 1.1* 1.1*         Lab results: 07/17/20  0930  07/06/20  1340 06/26/20  0931 06/11/20  1012 05/30/20  1205 05/22/20  1600   MCV 102.1* 103.6* 104.3* 107.1* 104.1* 109.1*         Lab results: 07/17/20  0930 07/06/20  1340 06/26/20  0931   Sodium 140 139 140   Potassium 4.0 3.8 4.1   Chloride 105 102 106   CO2 '26 23 26   ' UN '14 12 13   ' Creatinine 1.0 0.9 0.9   GFR,Caucasian 71.54 80.79 80.79   GFR,Black 86.56 97.75 97.75   Glucose 144* 112* 109*   Calcium 9.6 9.4 9.2   Total Protein 6.6 7.0 7.1   Albumin 3.9 4.0 4.2   ALT '24 22 27   ' AST 28 30 33   Alk Phos 72 77 82   Bilirubin,Total 0.5 0.3 0.3         Lab results: 05/22/20  1600 04/03/20  0700   Iron 50 100   Transferrin Saturation 15 28   Ferritin 139.6 195.00   TIBC  --  263   Vitamin B12 273  --    Folate 10.8  --          Lab results: 05/22/20  1600   Retic % 2.57*          Lab results: 07/17/20  0930 06/26/20  0933   Protein,UR 13.0 12.0         Lab results: 06/26/20  0931 06/11/20  1012 05/30/20  1347 05/22/20  1600 05/01/20  0700 04/30/20  0700   CEA 33.8* 38.5* 23.1* 15.3* 6.8 6.8         Lab results: 05/22/20  1600   COVID-19 Source Nasopharyngyl   COVID-19 PCR NEG     RADIOLOGY & SCREENING:   CT CAP: 04/26/2020, 05/28/20   PET/CT 05/09/2020   Bone scan: 06/08/20   Colonoscopy:  05/07/2020   Bone Density:  Never.    ASSESSMENT & PLAN: 84 years old male with history of,  1. 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  -S/P TOTAL ABDOMINAL COLECTOMY with ileal proctostomy and DIVERTING LOOP ILEOSTOMY on 09/12/2019 with METASTASIS to 29 of 30 lymph nodes, with extracapsular invasion, vascular invasion and POSITIVE distal and radial margins.  -S/P 12 cycles of adjuvant chemotherapy (2 cycles of FOLFOX, 10 cycles CAPEOX) from 10/23/2021 04/30/2020.  -Rising CEA and possible MESENTERIC ADENOPATHY and MASS AT ANASTOMOTIC SITE noted  on CT abdomen 04/27/2019: Stable/improved on CT 05/28/2020.  -PET/CT 05/09/2020 with low-level uptake in the pelvic lymph nodes and anastomotic site.  -COLONOSCOPY 05/07/2020 negative. He was started on XELIRI/Xeloda, Irinotecan and Avastin, cycle every 3 weeks on June 11, 2020     July 18, 2020: I met Mr. Josph Macho first time to continue treatment for history of sigmoid colon cancer.    1.  History of sigmoid colon cancer, clinically he is doing well except continues to have diarrhea, 4-5 stools per day, recently treated for C. difficile, no signs symptoms of active infection.  Reviewed and discussed situation with patient and encouraged him to continue Imodium.  Clear instructions for Imodium were reviewed with patient.  He had several questions about his previous treatments which were answered appropriately.  2.  We will monitor him closely with CEA and repeat restaging scan in 3 to 4 months.  3.  He spends winter in [place], and has established oncology service Dr. Frederico Hamman.  4.  Reviewed and discussed signs symptoms of C. difficile, and encouraged him to contact our office or go to ER immediately.  5.  Reviewed and discussed his recent CBC, CMP and CEA level and answered his questions.  6.  Return to clinic in 3 weeks with CBC, CMP, UA insulin level ordered today.  Will receive his third dose of chemotherapy next week.      -Findings and recommendations were reviewed with the patient.  -Opportunity to ask questions was provided. All questions were appropriately answered.  -Patient understands and is in agreement with current plan and recommendations.  -Patient was advised to call for any interim oncology or hematology problems.  I personally spent 65 minutes on the calendar day of the encounter, including pre and post visit work.      Dragon Armed forces training and education officer was used for this dictation. While the text is personally reviewed, in some instances syntactic and grammatical errors may be present related to the  software. If you have any questions regarding the contents of this note please feel free to contact me for clarification     Author: Haig Prophet, as of: 07/17/2020  at: 1:56 PM     Requested Prescriptions      No prescriptions requested or ordered in this encounter     Care Team            Sofie Rower, [place] PCP - Kandis Mannan, [facility]    Enrique Sack, MD   [phone] Radiation Oncology

## 2020-07-17 NOTE — Progress Notes (Signed)
Patient presents to [facility] infusion today for a port draw per MD order- vitals stable- left chest mediport accessed by Cloyde Reams, RN, using sterile technique- brisk blood return noted- waste obtained- labs collected and sent to [facility] lab- port flushed with NS and Heparin- port de-accessed- band aid applied- patient tolerated procedure well- patient aware of next appt- discharged ambulatory in stable condition.

## 2020-07-18 ENCOUNTER — Encounter: Payer: Self-pay | Admitting: Hematology & Oncology

## 2020-07-18 ENCOUNTER — Other Ambulatory Visit: Payer: Self-pay

## 2020-07-18 ENCOUNTER — Ambulatory Visit: Payer: Medicare (Managed Care) | Admitting: Hematology & Oncology

## 2020-07-18 VITALS — BP 129/67 | HR 77 | Temp 98.4°F | Resp 18 | Ht 67.52 in | Wt 186.5 lb

## 2020-07-18 DIAGNOSIS — C187 Malignant neoplasm of sigmoid colon: Secondary | ICD-10-CM

## 2020-07-18 LAB — CEA: CEA: 32.2 ng/mL — AB (ref 0.0–4.7)

## 2020-07-23 ENCOUNTER — Other Ambulatory Visit: Payer: Self-pay

## 2020-07-23 ENCOUNTER — Other Ambulatory Visit: Payer: Self-pay | Admitting: Pharmacist Clinician (PhC)/ Clinical Pharmacy Specialist

## 2020-07-23 DIAGNOSIS — C187 Malignant neoplasm of sigmoid colon: Secondary | ICD-10-CM

## 2020-07-23 MED ORDER — CAPECITABINE 500 MG PO TABS *I*
1000.0000 mg/m2 | ORAL_TABLET | Freq: Two times a day (BID) | ORAL | 0 refills | Status: DC
Start: 2020-07-23 — End: 2020-07-25
  Filled 2020-07-23: qty 112, 21d supply, fill #0
  Filled 2020-07-25: qty 112, 14d supply, fill #0
  Filled 2020-07-25: qty 112, 21d supply, fill #0

## 2020-07-23 NOTE — Telephone Encounter (Signed)
Patient needs refill capecitabine. Please advise, thank you.

## 2020-07-25 ENCOUNTER — Other Ambulatory Visit: Payer: Self-pay

## 2020-07-26 ENCOUNTER — Other Ambulatory Visit: Payer: Self-pay

## 2020-07-26 DIAGNOSIS — C779 Secondary and unspecified malignant neoplasm of lymph node, unspecified: Secondary | ICD-10-CM

## 2020-07-26 DIAGNOSIS — C187 Malignant neoplasm of sigmoid colon: Secondary | ICD-10-CM

## 2020-07-26 DIAGNOSIS — C801 Malignant (primary) neoplasm, unspecified: Secondary | ICD-10-CM

## 2020-07-26 DIAGNOSIS — T451X5A Adverse effect of antineoplastic and immunosuppressive drugs, initial encounter: Secondary | ICD-10-CM

## 2020-07-26 DIAGNOSIS — G620 Drug-induced polyneuropathy: Secondary | ICD-10-CM

## 2020-07-27 ENCOUNTER — Other Ambulatory Visit: Payer: Self-pay | Admitting: Hematology & Oncology

## 2020-07-27 LAB — COMPREHENSIVE METABOLIC PANEL
ALT: 20 U/L (ref 0–50)
AST: 26 U/L (ref 0–50)
Albumin: 3.8 gm/dL (ref 3.5–5.0)
Alk Phos: 69 U/L (ref 38–126)
Anion Gap: 14 MMOL/L
Bilirubin,Total: 0.3 mg/dL (ref 0.0–1.2)
CO2: 26 mmol/L (ref 21–31)
Calcium: 9.4 mg/dL (ref 8.6–10.2)
Chloride: 106 mmol/L (ref 98–107)
Creatinine: 1 mg/dL (ref 0.6–1.3)
GFR,Black: 86.56 mL/min/{1.73_m2} (ref 60–999)
GFR,Caucasian: 71.54 mL/min/{1.73_m2} (ref 60–999)
Glucose: 100 mg/dL — ABNORMAL HIGH (ref 60–99)
Lab: 15 mg/dL (ref 6–20)
Potassium: 3.8 mmol/L (ref 3.4–4.7)
Sodium: 141 mmol/L (ref 133–145)
Total Protein: 6.9 gm/dL (ref 6.0–8.3)

## 2020-07-27 LAB — CBC
Baso # K/uL: 0 10*3/uL (ref 0–0.20)
Basophil %: 0.9 % (ref 0–1)
Eos # K/uL: 0.3 10*3/uL (ref 0–0.45)
Eosinophil %: 5.8 % — ABNORMAL HIGH (ref 0–5)
Hematocrit: 37.8 % — ABNORMAL LOW (ref 42.0–52.0)
Hemoglobin: 12.8 gm/DL (ref 12.0–18.0)
IMM Granulocytes: 0.4 % (ref 0–1)
Immature Granulocytes Absolute: 0.02 10*3/uL
Lymph # K/uL: 1.7 10*3/uL (ref 1.0–4.8)
Lymphocyte %: 35.6 % (ref 25.0–45.0)
MCH: 34.5 UUG — ABNORMAL HIGH (ref 29–32)
MCHC: 33.9 g/dl (ref 30–36)
MCV: 101.9 fl — ABNORMAL HIGH (ref 84.5–98.5)
Mono # K/uL: 0.6 10*3/uL (ref 0–0.80)
Monocyte %: 12.7 % — ABNORMAL HIGH (ref 1.7–9.3)
Neut # K/uL: 2.1 10*3/uL (ref 1.8–7.8)
Nucl RBC # K/uL: 0 10*3/uL (ref 0.0–0.0)
Nucl RBC %: 0 % (ref 0–2)
Platelets: 215 10*3/uL (ref 150–400)
RBC: 3.71 M/uL — ABNORMAL LOW (ref 4.5–6.5)
RDW: 14.7 % — ABNORMAL HIGH (ref 11.5–14.5)
Seg Neut %: 44.6 % (ref 40–60)
WBC: 4.7 10*3/uL — ABNORMAL LOW (ref 5.0–11.0)

## 2020-07-27 LAB — MAGNESIUM: Magnesium: 1.8 mg/dL (ref 1.6–2.5)

## 2020-07-27 NOTE — Progress Notes (Signed)
Patient presents to [facility] infusion today for a port draw per MD order- vitals stable- left chest mediport accessed by this writer, using sterile technique- brisk blood return noted- waste obtained- labs collected and sent to [facility] lab- port flushed with NS and Heparin- port de-accessed- band aid applied- patient tolerated procedure well- patient aware of next appt- discharged ambulatory in stable condition.

## 2020-07-29 ENCOUNTER — Other Ambulatory Visit: Payer: Self-pay | Admitting: Hematology & Oncology

## 2020-07-31 ENCOUNTER — Other Ambulatory Visit: Payer: Self-pay | Admitting: Hematology & Oncology

## 2020-07-31 MED FILL — Irinotecan HCl Inj 100 MG/5ML (20 MG/ML): INTRAVENOUS | Qty: 20.3 | Status: AC

## 2020-07-31 MED FILL — Bevacizumab-bvzr IV Soln 400 MG/16ML (For Infusion): INTRAVENOUS | Qty: 25.8 | Status: AC

## 2020-07-31 NOTE — Progress Notes (Signed)
Patient arrived to infusion center for his treatment. Ambulatory to chair 5. VSS. Orders/consent/hand off/dosages reviewed. Port accessed with sterile technique with blood return.  Pre meds given. ZIRABEV 645mg  hooked up and ran over 30 minutes per orders. Line flushed. Pt tolerated well. Irinotecan 406mg  hooked up and infused over approx. 90 minutes per order. Patient tolerated well. Line flushed. Port flushed with saline and heparin per protocol. De-accessed port. Spot bandaid applied. Patient aware of next scheduled appointment. Ambulatory for discharge in stable condition.

## 2020-08-06 ENCOUNTER — Other Ambulatory Visit: Payer: Self-pay

## 2020-08-06 DIAGNOSIS — G620 Drug-induced polyneuropathy: Secondary | ICD-10-CM

## 2020-08-06 DIAGNOSIS — C801 Malignant (primary) neoplasm, unspecified: Secondary | ICD-10-CM

## 2020-08-06 DIAGNOSIS — C779 Secondary and unspecified malignant neoplasm of lymph node, unspecified: Secondary | ICD-10-CM

## 2020-08-06 DIAGNOSIS — C187 Malignant neoplasm of sigmoid colon: Secondary | ICD-10-CM

## 2020-08-06 DIAGNOSIS — T451X5A Adverse effect of antineoplastic and immunosuppressive drugs, initial encounter: Secondary | ICD-10-CM

## 2020-08-06 NOTE — Progress Notes (Signed)
PATIENT NAME: Carlos Woods (MRN: [id number])  DATE OF SERVICE: 08/08/2020     REASON FOR CONSULATION:  1. 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  2. Prior to the 3rd cycle of  XELIRI and Avastin.     CHIEF COMPLAINT: I had rash on my scalp.    ONCOLOGY HISTORY / HISTORY OF PRESENT ILLNESS:  Oncology History   Cancer of sigmoid colon   09/10/2019 Genetic Testing    -MOLECULAR TESTING ON SIGMOID COLON specimen from 09/10/2019, by [employer] was positive for K-ras pathogenic/p.g12V, negative for ERBB2/HER-2/neu, and other cancer type relevant biomarkers including MSI (stable), PD-L1 (0%),NTRK1/2/3, TMB low (3 mut/mb), BRAF, NRAS, PIK3CA.  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  -S/P TOTAL ABDOMINAL COLECTOMY with ileal proctostomy and DIVERTING LOOP ILEOSTOMY on 09/12/2019 with METASTASIS to 29 of 30 lymph nodes, with extracapsular invasion, vascular invasion and POSITIVE distal and radial margins.  -S/P 12 cycles of ADJUVANT CHEMOTHERAPY (2 cycles of FOLFOX, 10 cycles CAPEOX) from 10/23/2020 to 04/30/2020.  -Patient with self-medication with capecitabine alone on 05/21/2020 and 05/28/2020.  -Patient with stage IIIC colon cancer who has been receiving adjuvant chemotherapy in [place] (Dr. Warner Mccreedy) and is in the [place] area until October and presents for further treatment and follow-up.  -History is summarized from documentation received from Dr. Frederico Hamman:  -Patient apparently presented with SIGMOID COLON OBSTRUCTION and contained PERFORATION of the colon.  -CT ABDOMEN PELVIS on 09/10/2019 reportedly  showed colonic diverticulosis with acute diverticulitis sigmoid colon, bowel obstruction, likely secondary to sigmoid colon mass.  -S/P TOTAL ABDOMINAL COLECTOMY with ileal proctostomy and DIVERTING LOOP ILEOSTOMY on 09/12/2019.  -Pathology from 09/10/2019 showed pT4 pN2b, grade 3, INVASIVE MUCINOUS AND SIGNET RING ADENOCARCINOMA arising in the sigmoid diverticulum with perforation.  Tumor invaded through the bowel wall into the pericolic fat.  Radial and distal MARGINS WERE POSITIVE for tumor.  29 of 30 lymph nodes were positive for metastatic mucinous adenocarcinoma with extracapsular invasion and vascular invasion.  Diffuse diverticulosis and bowel obstruction noted.  -Patient was seen by Dr. Frederico Hamman on 10/12/2019 and was advised staging scans, Guardant 360 and adjuvant treatment with FOLFOX.  -Patient was apparently STARTED ON ADJUVANT FOLFOX on 10/24/2019 and treatment was complicated by recurrent chest pain and shortness of breath, requiring hospitalization and cardiac catheterization after the 2nd cycle of FOLFOX on 10/31/2019.      -CTA CHEST on 11/16/2019, showed a large left-sided PULMONARY EMBOLISM, and patient is currently on Eliquis.  -PET/CT on 11/22/2019 with mild activity in the upper right hilum with SUV 1.8 and activity in the left hilum with SUV 3.4, without evidence of adenopathy or mass.  No evidence of recurrent or new neoplasm noted.  -Patient only recalls 1 episode of chest pain related to PE.    -Patient was apparently SWITCHED TO XELODA on 11/28/2019, after 2 cycles of FOLFOX and has received 12 cycles of chemotherapy with 2 cycles of FOLFOX and 10 cycles of CAPEOX, per phone call to [facility], infusion RN.   -CT CAP on 04/26/2020 compared with 02/02/2020, showed new multifocal peripheral volume loss and fissural  nodularity, multifocal mesenteric fat stranding and soft tissue mass about the sigmoid anastomosis 3 x 2.8 cm extending to the left posterior bladder and 4.4 x 2.4 cm  soft tissue focus extending more superiorly.  Left and central pelvic mesenteric nodules with largest measuring 1.4 x 1.2 cm compared to 1.3 x 1.1 cm and another 1.7 x 1.1 cm versus 1.4 x 0.9 cm.  Development of a 2.4 x 1.0 cm focus of nodularity to the left of the bladder extending to the sigmoid colon to the anterior abdominal wall.  Stable 2.5 x 1.2 cm left external adenopathy.  -Latest available PET/CT on 05/09/2020 compared with CT scan 04/26/2020 and PET/CT 11/22/2019 showed pleural-based area of consolidation within the lungs bilaterally (most prominent posterior LUL), new since 04/26/2020, concerning for pneumonia, secondary changes of pulmonary embolic disease are more and inflammatory process such as cryptogenic organizing pneumonia.  Lymphadenopathy in the pelvis with SUV 2.8 and soft tissue at the anastomotic site with SUV 3.9 are again identified and better seen on CT scan 04/26/2020.  Asymmetric abnormal activity noted right mandible and an area for dental implant with SUV 6.3.  -The patient has been receiving oxaliplatin 85 mg every 2 weeks and received the 1st cycle of Xeloda with oxaliplatin on 12/12/2019 and the 9th cycle on 04/30/2020.     -Per Dr. Lorane Gell note 05/10/2020, patient was apparently treated for tooth infection with antibiotics and tooth extraction and he had a colonoscopy which was negative, including anastomotic site.    -Patient was apparently advised to continue capecitabine 2000 mg twice daily for 7 days on, 7 days off and oxaliplatin 85 mg/m every 2 weeks to a total dose of 150 mg.    -Colonoscopy to mid sigmoid colon on 05/07/20 showed previous end to end ileo clonic anastomosis was seen the the mid sigmoid colon, S/P subtotal colectomy.  -On 10/12/2019, CEA was 24.7, and 4.6 on 01/09/2020, 5.7 on 04/02/2020, and 6.8 on 04/30/2020.      -Patient was first seen at [facility] on 05/22/2020.  -Per discussion with Dr. Warner Mccreedy on 05/22/2020, he indicated that he did review the patient's chart and  stated that he does not recommend further adjuvant chemotherapy, at this time.  He did not think that the patient has any definite evidence of residual disease/metastases and will reach out to the patient and convey this to him.  He also noted that there was no treatment offered for findings of possible pneumonia on recent PET/CT, since patient was asymptomatic.    -We also discussed possible adjuvant radiotherapy and apparently patient has previously refused radiation consultation on a couple occasions.  -Patient self initiated the 10th and 11th cycle of capecitabine alone on 05/13/2020 and 05/27/2020.     10/12/2019 Genetic Testing    -GUARDANT 360 on 10/12/2019 showed K-ras G 12V mutation, MSS, TMB 10.58 mut/MB and was negative for MSI high, K-ras, NRAS, BRAF, ERBB2, NTRK. VUS NOTED WERE CDKN2A, NF1.     05/28/2020 Significant Radiology Findings    -On 10/12/2019, CEA was 24.7, and 4.6 on 01/09/2020.  -Rising CEA noted - 5.7 on 04/02/2020, 6.8 on 04/30/2020, 15.3 on 05/22/2020 and 38.5 on 06/11/2020.  -CT chest, abdomen and pelvis on 05/28/20 compared with PET/CT 05/09/20 and CT CAP on 04/26/20 showed decreasing peripheral patchy groundglass opacities/densities in the lung.  Target lesion in the LUL was 20 x 11 versus 29 x 12 mm and RLL was 13 x 11 versus 24 x 17 mm.  Minimal peripheral groundglass  opacity suggests inflammatory change, interstitial infiltrate, superimposed on fibrotic findings.  No new nodules.  31 x 30 mm spiculated appearing pelvic mass, without change, likely patient's primary neoplasm with adjacent adenopathy noted.  Largest dominant node anterior to the primary lesion is 15 x 14 mm versus 29 x 23 mm.    -On 05/30/20 CEA was 23.1, this was sent to Dr, Frederico Hamman and I did leave a message for him to review further management, per patient request.  -Dr. Frederico Hamman did return my call on 06/01/2020 and we discussed patient status extensively.  We are both in agreement, that the current chemotherapy is likely not working  due to rising CEA since it is nadired in February 2021.  However, we also reviewed the fact that his scans scans do not show any obvious evidence of progression or definite measurable disease.  We discussed options for close observation, since he has completed 12 cycles of adjuvant chemotherapy versus proceeding with second line chemotherapy with capecitabine and irinotecan.  Dr. Frederico Hamman did favor proceeding with capecitabine and irinotecan, in view of rising CEA and patient's anxiety in spite of no definite progression on scans.  -His nurse practitioner, Estill Bamberg, did reach out to our office on 06/01/2020, to communicate Dr. Lorane Gell recommendation of proceeding with capecitabine, irinotecan and Avastin.    -Patient has repeatedly refused another opinion or tertiary cancer center evaluation.  -Per discussion with Dr. Frederico Hamman, capecitabine, irinotecan and Avastin started 06/11/2020.     06/11/2020 -  Chemotherapy    OP GI Colon Capecitabine Irinotecan Bevacizumab  Plan Provider: Drenda Freeze, MD  Treatment goal: Palliative  Line of treatment: [No plan line of treatment]       INTERVAL HISTORY: Mr. Josph Macho is here for follow-up, accompanied by his wife, developed multiple rashes on his scalp cheeks and chin with mild off-and-on itching but no other associated concerns, continues to have mild off-and-on diarrhea, otherwise Denies headache, visual impairment, denies chest pain, shortness breath, denies nausea, vomiting or diarrhea. Reviewed labs and EMR.     REVIEW of SYSTEMS:  The complete review of system is otherwise negative except as mentioned in interval history.     PAST MEDICAL HISTORY:  Past Medical History:   Diagnosis Date    Cancer of sigmoid colon 09/10/2019    Diabetes     Hypertension     Pulmonary embolism on left 11/16/2019     PAST SURGICAL HISTORY:  Past Surgical History:   Procedure Laterality Date    APPENDECTOMY Right 1942    Reversal of diverting ileostomy      TOTAL COLECTOMY N/A 09/10/2019      BLOOD CLOTS HISTORY: Left pulmonary embolism 11/16/2019.  PREVIOUS CHEMOTHERAPY: As noted above.    FAMILY HISTORY:  No family history on file.    SOCIAL HISTORY:  Social History     Social History Narrative    Patient is widowed and has 4 children.  Patient worked as a Conservation officer, historic buildings and denies any exposures to chemicals or radiation.  Patient denies smoking on a regular basis.  Patient denies regular alcohol use.    Patient denies use of chewing tobacco or illicit drugs.     ALLERGIES:  Allergies   Allergen Reactions    5 Fu [Fluorouracil] Other (See Comments)     Chest pain    Sulfa Antibiotics Other (See Comments)     unknown reaction      MEDICATIONS:  Current Outpatient Medications   Medication Sig  cholestyramine (QUESTRAN) 4 g packet cholestyramine (with sugar) 4 gram powder for susp in a packet   takes once q 2 days    apixaban (ELIQUIS) 5 MG tablet Eliquis 5 mg tablet   Take 1 tablet(s) twice a day by oral route.    aspirin 81 MG EC tablet every 24 hours    metFORMIN (GLUCOPHAGE) 500 MG tablet metformin 500 mg tablet   TAKE 1 TABLET BY MOUTH ONCE DAILY    tamsulosin (FLOMAX) 0.4 MG capsule every 24 hours   Patient's medications, allergies, past medical, surgical, social and family histories were reviewed with the patient and pertinent facts were updated for our records.    ADVANCE CARE DIRECTIVES:  Has patient (or family) completed any of the following? (select all that apply): Health Care Proxy (HCP)  HCP Name: ABDIAZIZ KLAHN HCP Phone Number: [phone]  HCP available for inclusion in the chart?: No  DNR:  No.     VITAL SIGNS & PERFORMANCE STATUS:      There is no height or weight on file to calculate BMI. There is no height or weight on file to calculate BSA.  Wt Readings from Last 3 Encounters:   07/18/20 84.6 kg (186 lb 8.2 oz)   06/27/20 85.1 kg (187 lb 9.8 oz)   06/06/20 86 kg (189 lb 9.5 oz)     There were no vitals filed for this visit.     PHYSICAL EXAM:   ECOG: 1    General: Patient appears: in no apparent distress, well developed and well nourished.   Head: Atraumatic and normocephalic.   Eyes: No scleral icterus, and/or pallor. Conjunctiva clear.   Ears, Nose, Mouth & Throat: Neck supple. No lymphadenopathy. Thyroid midline and normal in size.   Neck: No lymphadenopathy present. Neck supple.   Cardiovascular: Heart with regular rate and rhythm. Normal S1 and S2. No gallop. No rubs or clicks. No bruits present.   Chest: Clear no rales/rhonchi. No dullness to percussion. No wheezing. Chest wall expansion normal. No chest wall tenderness. No   stridor.   Abdomen: Soft. Non-tender. Non-distended. No palpable masses. No ascites. No hepatosplenomegaly. Bowel sounds present in all   quadrants.   Hematologic/Lymphatic: No cervical lymphadenopathy. No supraclavicular lymphadenopathy. No axillary lymphadenopathy. No inguinal   lymphadenopathy.   Skin: Multiple rashes noted on scalp chain bilaterally on face, with small tiny nodular sensation on palpation,.  Extremities: No edema. No cyanosis. No clubbing. Discoloration none.   Neurologic: Alert and oriented. Normal speech. sensation intact. No focal sensory or motor deficit.     DATA REVIEWED:        Lab results: 07/27/20  1000 07/17/20  0930 07/06/20  1340   WBC 4.7* 4.3* 6.2   Hemoglobin 12.8 13.0 12.9   Hematocrit 37.8* 38.3* 37.8*   RBC 3.71* 3.75* 3.65*   Platelets 215 181 176   Neut # K/uL 2.1 2.3 3.4   Lymph # K/uL 1.7 1.4 1.7   Mono # K/uL 0.6 0.3 0.6   Eos # K/uL 0.3 0.2 0.4   Baso # K/uL 0.0 0.0 0.1   Seg Neut % 44.6 53.3 54.4   Lymphocyte % 35.6 32.9 27.9   Monocyte % 12.7* 7.7 10.3*   Eosinophil % 5.8* 4.7 6.0*   Basophil % 0.9 0.7 1.1*         Lab results: 07/27/20  1000 07/17/20  0930 07/06/20  1340 06/26/20  0931 06/11/20  1012 05/30/20  1205   MCV 101.9* 102.1*  103.6* 104.3* 107.1* 104.1*         Lab results: 07/27/20  1000 07/17/20  0930 07/06/20  1340   Sodium 141 140 139   Potassium 3.8 4.0 3.8   Chloride 106 105  102   CO2 '26 26 23   ' UN '15 14 12   ' Creatinine 1.0 1.0 0.9   GFR,Caucasian 71.54 71.54 80.79   GFR,Black 86.56 86.56 97.75   Glucose 100* 144* 112*   Calcium 9.4 9.6 9.4   Total Protein 6.9 6.6 7.0   Albumin 3.8 3.9 4.0   ALT '20 24 22   ' AST '26 28 30   ' Alk Phos 69 72 77   Bilirubin,Total 0.3 0.5 0.3         Lab results: 05/22/20  1600 04/03/20  0700   Iron 50 100   Transferrin Saturation 15 28   Ferritin 139.6 195.00   TIBC  --  263   Vitamin B12 273  --    Folate 10.8  --          Lab results: 05/22/20  1600   Retic % 2.57*          Lab results: 07/17/20  0930 06/26/20  0933   Protein,UR 13.0 12.0         Lab results: 07/17/20  0930 06/26/20  0931 06/11/20  1012 05/30/20  1347 05/22/20  1600 05/01/20  0700   CEA 32.2* 33.8* 38.5* 23.1* 15.3* 6.8         Lab results: 05/22/20  1600   COVID-19 Source Nasopharyngyl   COVID-19 PCR NEG     RADIOLOGY & SCREENING:   CT CAP: 04/26/2020, 05/28/20   PET/CT 05/09/2020   Bone scan: 06/08/20   Colonoscopy:  05/07/2020   Bone Density:  Never.    ASSESSMENT & PLAN: 84 years old male with history of,  1. 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  -S/P TOTAL ABDOMINAL COLECTOMY with ileal proctostomy and DIVERTING LOOP ILEOSTOMY on 09/12/2019 with METASTASIS to 29 of 30 lymph nodes, with extracapsular invasion, vascular invasion and POSITIVE distal and radial margins.  -S/P 12 cycles of adjuvant chemotherapy (2 cycles of FOLFOX, 10 cycles CAPEOX) from 10/23/2021 04/30/2020.  -Rising CEA and possible MESENTERIC ADENOPATHY and MASS AT ANASTOMOTIC SITE noted on CT abdomen 04/27/2019: Stable/improved on CT 05/28/2020.  -PET/CT 05/09/2020 with low-level uptake in the pelvic lymph nodes and anastomotic site.  -COLONOSCOPY 05/07/2020 negative. He was started on XELIRI/Xeloda, Irinotecan and Avastin, cycle every 3  weeks on June 11, 2020     July 18, 2020: I met Mr. Josph Macho first time to continue treatment for history of sigmoid colon cancer.    1.  History of sigmoid colon cancer, Regarding skin rash on face and scalp, reviewed and discussed situation and explained him currently secondary to medication side effect especially Xeloda or Herceptin.  Since he is asymptomatic, can be monitored closely, if his rash gets worse, will treat him, encouraged him to take over-the-counter Claritin in case if he develops itching and contact our office immediately.  2.  We will monitor him closely with CEA and repeat restaging scan in 3 to 4 months.  3.  He spends winter in [place], and has established oncology service Dr. Frederico Hamman, He is planning to relocate to [place] on October 2, will receive his  treatment on September 25 and then go to [place].  We will make sure to transfer his records to his oncologist in [place], and will be happy to talk to him as well.  Encourage patient and his wife to contact our office in case him to have any questions or concerns.  4.  Reviewed and discussed signs symptoms of C. difficile, and encouraged him to contact our office or go to ER immediately.  5.  Reviewed and discussed his recent CBC, CMP and CEA level and answered his questions.  6.  No follow-up scheduled, patient willing return back to [place] next year in early summer, will call her office before arrival to schedule appointment to continue treatment.    -Findings and recommendations were reviewed with the patient.  -Opportunity to ask questions was provided. All questions were appropriately answered.  -Patient understands and is in agreement with current plan and recommendations.  -Patient was advised to call for any interim oncology or hematology problems.    Marcy Salvo, MD    Dragon voice recognition software was used for this dictation. While the text is personally reviewed, in some instances syntactic and grammatical errors may be present  related to the software. If you have any questions regarding the contents of this note please feel free to contact me for clarification        Requested Prescriptions      No prescriptions requested or ordered in this encounter     Care Team              [place], DO PCP - General [phone]    Warner Mccreedy, MD Hematology and Oncology [phone]    Enrique Sack, MD Radiation Oncology [phone]

## 2020-08-07 ENCOUNTER — Encounter: Payer: Self-pay | Admitting: Family Medicine

## 2020-08-07 ENCOUNTER — Other Ambulatory Visit: Payer: Self-pay | Admitting: Hematology & Oncology

## 2020-08-07 LAB — URINALYSIS WITH REFLEX TO MICROSCOPIC
Bilirubin,Ur: NEGATIVE
Blood,UA: NEGATIVE
Glucose,UA: NEGATIVE mg/dL
Ketones, UA: NEGATIVE mg/dL
Leuk Esterase,UA: NEGATIVE
Nitrite,UA: NEGATIVE
Protein,UA: NEGATIVE
Specific Gravity,UA: 1.025 (ref 1.005–1.025)
Urobilinogen,UA: 0.2 mg/dL (ref 0.2–1.0)
pH,UA: 5.5 (ref 5–7)

## 2020-08-07 LAB — COMPREHENSIVE METABOLIC PANEL
ALT: 21 U/L (ref 0–50)
AST: 23 U/L (ref 0–50)
Albumin: 3.9 gm/dL (ref 3.5–5.0)
Alk Phos: 78 U/L (ref 38–126)
Anion Gap: 12 MMOL/L
Bilirubin,Total: 0.4 mg/dL (ref 0.0–1.2)
CO2: 26 mmol/L (ref 21–31)
Calcium: 8.6 mg/dL (ref 8.6–10.2)
Chloride: 102 mmol/L (ref 98–107)
Creatinine: 0.9 mg/dL (ref 0.6–1.3)
GFR,Black: 97.75 mL/min/{1.73_m2} (ref 60–999)
GFR,Caucasian: 80.79 mL/min/{1.73_m2} (ref 60–999)
Glucose: 105 mg/dL — ABNORMAL HIGH (ref 60–99)
Lab: 15 mg/dL (ref 6–20)
Potassium: 3.8 mmol/L (ref 3.4–4.7)
Sodium: 136 mmol/L (ref 133–145)
Total Protein: 6.8 gm/dL (ref 6.0–8.3)

## 2020-08-07 LAB — CBC
Baso # K/uL: 0 10*3/uL (ref 0–0.20)
Basophil %: 0.3 % (ref 0–1)
Eos # K/uL: 0 10*3/uL (ref 0–0.45)
Eosinophil %: 0.7 % (ref 0–5)
Hematocrit: 37.7 % — ABNORMAL LOW (ref 42.0–52.0)
Hemoglobin: 12.9 gm/DL (ref 12.0–18.0)
IMM Granulocytes: 1 % (ref 0–1)
Immature Granulocytes Absolute: 0.03 10*3/uL
Lymph # K/uL: 1.3 10*3/uL (ref 1.0–4.8)
Lymphocyte %: 42 % (ref 25.0–45.0)
MCH: 34 UUG — ABNORMAL HIGH (ref 29–32)
MCHC: 34.2 g/dl (ref 30–36)
MCV: 99.5 fl — ABNORMAL HIGH (ref 84.5–98.5)
Mono # K/uL: 0.3 10*3/uL (ref 0–0.80)
Monocyte %: 8.8 % (ref 1.7–9.3)
Neut # K/uL: 1.5 10*3/uL — ABNORMAL LOW (ref 1.8–7.8)
Nucl RBC # K/uL: 0 10*3/uL (ref 0.0–0.0)
Nucl RBC %: 0 % (ref 0–2)
Platelets: 141 10*3/uL — ABNORMAL LOW (ref 150–400)
RBC: 3.79 M/uL — ABNORMAL LOW (ref 4.5–6.5)
RDW: 14.4 % (ref 11.5–14.5)
Seg Neut %: 47.2 % (ref 40–60)
WBC: 3.1 10*3/uL — ABNORMAL LOW (ref 5.0–11.0)

## 2020-08-07 NOTE — Progress Notes (Signed)
Pt. arrived to [facility] infusion center ambulatory to chair #6. VS obtained and stable. Orders reviewed. Left chest wall port accessed by writer, positive blood return noted. Labs drawn as ordered. Port flushed per protocol and de accessed, band aid applied. Urine sample collected. Patient discharge home, stable and ambulatory. Aware of next scheduled appointment.

## 2020-08-07 NOTE — Patient Instructions (Addendum)
08/17/20:PORT Draw@[facility] @10 :00AM  08/20/20:Treatment@[facility] @10 :00AM    -Capecitabine 2000 mg p.o. within 30 minutes of a meal, twice daily x14 days, starting day 1, followed by 1 week off. (see attached calendar)         Your Provider Today is Drenda Freeze MD  Contact Information for the [facility] [place] is [phone]        TREATMENT PLAN: capecitabine, irinotecan, and avastin       In an effort to provide you with the best continuity of care, please contact our office at [phone] if you are admitted to a hospital.    If you are in need of any Social Work services, including but not limited to:   Statistician supply   Home care services    Or, if you have any other questions, concerns, or needs, please contact [facility], [place], [place] at [phone]               August 2021      Sunday Monday Tuesday Wednesday Thursday Friday Saturday   1     2     3  Portdraw/urine@9:30AM   4  Dr. Soni @1130   5     6     7       8     9  Dr. Gregory Hare appointment @ 8:30 am 10     11     12     13        15     16  Labs/Treatment Due@1100    Capecitabine AM  Capecitabine PM   17  Capecitabine AM  Capecitabine PM   18  Capecitabine AM  Capecitabine PM   19  Capecitabine AM  Capecitabine PM   20  Capecitabine AM  Capecitabine PM   21  Capecitabine AM  Capecitabine PM     22   Capecitabine AM  Capecitabine PM   23    Capecitabine AM  Capecitabine PM   24    Capecitabine AM  Capecitabine PM    Portdraw/Urine@9:30 am           25    Capecitabine AM  Capecitabine PM        Dr. Soni@1:00 pm             26    Capecitabine AM  Capecitabine PM           27    Capecitabine AM  Capecitabine PM           28    Capecitabine AM  Capecitabine PM             29   Capecitabine AM  Capecitabine PM   30    OFF 31    OFF                                    September 2021      Sunday Monday Tuesday Wednesday Thursday Friday Saturday                   1  OFF   2  OFF    3  OFF  Portdraw@10 :00AM CBC,CMP,UA 4  OFF     5  OFF   6      Capecitabine AM  Capecitabine PM     7  Treatment@10AM   Capecitabine AM  Capecitabine PM 8      Capecitabine AM  Capecitabine PM 9      Capecitabine AM  Capecitabine PM 10      Capecitabine AM  Capecitabine PM 11      Capecitabine AM  Capecitabine PM   12    Capecitabine AM  Capecitabine PM   13    Capecitabine AM  Capecitabine PM 14  Port draw @11 :00AM CBC,CMP,UA, CEA@ [facility] @ 11:00 am Capecitabine AM  Capecitabine PM 15  Dr. Dorita Fray @ 12:40 pm  Capecitabine AM  Capecitabine PM 16    Capecitabine AM  Capecitabine PM 17    Capecitabine AM  Capecitabine PM 18    Capecitabine AM  Capecitabine PM   19  Capecitabine AM  Capecitabine PM   20    OFF   21    OFF 22    OFF 23    OFF 24  Port draw @10 :00am  OFF 25    OFF   26    OFF   27  Treatment@  10:00am    Capecitabine AM  Capecitabine PM 28      Capecitabine AM  Capecitabine PM 29      Capecitabine AM  Capecitabine PM 30      Capecitabine AM  Capecitabine PM                       October 2021      Sunday Monday Tuesday Wednesday Thursday Friday Saturday                            1  Capecitabine AM  Capecitabine PM   2  Capecitabine AM  Capecitabine PM     3  Capecitabine AM  Capecitabine PM   4  Capecitabine AM  Capecitabine PM   5  Capecitabine AM  Capecitabine PM   6  Capecitabine AM  Capecitabine PM   7  Capecitabine AM  Capecitabine PM   8  Capecitabine AM  Capecitabine PM   9  Capecitabine AM  Capecitabine PM     10  Capecitabine AM  Capecitabine PM   11    OFF 12    OFF 13    OFF 14    OFF 15    OFF 16    OFF   17  OFF   18     19     20     21     22     23       24     25     26     27     28     29     30       31

## 2020-08-08 ENCOUNTER — Encounter: Payer: Self-pay | Admitting: Hematology & Oncology

## 2020-08-08 ENCOUNTER — Ambulatory Visit: Payer: Medicare (Managed Care) | Admitting: Hematology & Oncology

## 2020-08-08 ENCOUNTER — Other Ambulatory Visit: Payer: Self-pay

## 2020-08-08 VITALS — BP 130/70 | HR 77 | Temp 97.8°F | Resp 16 | Ht 67.95 in | Wt 183.4 lb

## 2020-08-08 DIAGNOSIS — C187 Malignant neoplasm of sigmoid colon: Secondary | ICD-10-CM

## 2020-08-08 LAB — CEA: CEA: 29.5 ng/mL — AB (ref 0.0–4.7)

## 2020-08-08 MED ORDER — CAPECITABINE 500 MG PO TABS *I*
1000.0000 mg/m2 | ORAL_TABLET | Freq: Two times a day (BID) | ORAL | 0 refills | Status: AC
Start: 2020-08-08 — End: 2020-08-22
  Filled 2020-08-08 – 2020-08-13 (×2): qty 112, 21d supply, fill #0

## 2020-08-08 NOTE — Progress Notes (Signed)
[place] TREATMENT HAND-OFF TOOL:   SITUATION:   Scheduled treatment category for today:     Cancer treatment:  Chemotherapy    Is this a new cancer treatment?: Yes      Patient seen by MD    Consent obtained:  Yes    Location:  To be scanned    Labs complete:  Pending    Current patient status:  Scheduled treatment    OK to treat for scheduled treatment:  Yes      Okay to treat D1C4 Irinotecan with Avastin on 08/20/20 pending labs/nursing assessment.

## 2020-08-13 ENCOUNTER — Other Ambulatory Visit: Payer: Self-pay

## 2020-08-13 ENCOUNTER — Other Ambulatory Visit: Payer: Self-pay | Admitting: Pharmacist Clinician (PhC)/ Clinical Pharmacy Specialist

## 2020-08-13 DIAGNOSIS — C187 Malignant neoplasm of sigmoid colon: Secondary | ICD-10-CM

## 2020-08-13 NOTE — Progress Notes (Signed)
Carlos Woods is being discharged from the [facility] 08/13/2020 because patient will be transferring care back to [place]..    Patient reports he has adequate supply of capecitabine for upcoming cycle. He will then go to [place] and they will take over his prescribing at that time. Advised patient that we have one fill on file if he needs any transitional supply. He will contact us directly if needed.    Notified Carlos Woods over the phone that we will not plan to follow up, but encouraged him to call with any questions or concerns. Patient is agreeable.    Thank you for allowing me to participate in the care of this patient. Please contact me with any questions      Lindwood Coke, PharmD  [facility]  Phone: [phone]  Fax: [phone]

## 2020-08-17 ENCOUNTER — Other Ambulatory Visit: Payer: Self-pay | Admitting: Hematology & Oncology

## 2020-08-17 ENCOUNTER — Encounter: Payer: Self-pay | Admitting: Family Medicine

## 2020-08-17 LAB — URINALYSIS WITH REFLEX TO MICROSCOPIC
Bilirubin,Ur: NEGATIVE
Blood,UA: NEGATIVE
Glucose,UA: NEGATIVE mg/dL
Ketones, UA: NEGATIVE mg/dL
Leuk Esterase,UA: NEGATIVE
Nitrite,UA: NEGATIVE
Protein,UA: NEGATIVE
Specific Gravity,UA: >1.03 — ABNORMAL HIGH (ref 1.005–1.025)
Urobilinogen,UA: 0.2 mg/dL (ref 0.2–1.0)
pH,UA: 5 (ref 5–7)

## 2020-08-17 LAB — COMPREHENSIVE METABOLIC PANEL
ALT: 17 U/L (ref 0–50)
AST: 25 U/L (ref 0–50)
Albumin: 3.8 gm/dL (ref 3.5–5.0)
Alk Phos: 90 U/L (ref 38–126)
Anion Gap: 17 MMOL/L
Bilirubin,Total: 0.2 mg/dL (ref 0.0–1.2)
CO2: 23 mmol/L (ref 21–31)
Calcium: 9.1 mg/dL (ref 8.6–10.2)
Chloride: 106 mmol/L (ref 98–107)
Creatinine: 1 mg/dL (ref 0.6–1.3)
GFR,Black: 86.56 mL/min/{1.73_m2} (ref 60–999)
GFR,Caucasian: 71.54 mL/min/{1.73_m2} (ref 60–999)
Glucose: 165 mg/dL — ABNORMAL HIGH (ref 60–99)
Lab: 9 mg/dL (ref 6–20)
Potassium: 3.8 mmol/L (ref 3.4–4.7)
Sodium: 142 mmol/L (ref 133–145)
Total Protein: 6.5 gm/dL (ref 6.0–8.3)

## 2020-08-17 LAB — CBC
Baso # K/uL: 0.1 10*3/uL (ref 0–0.20)
Basophil %: 1.2 % — ABNORMAL HIGH (ref 0–1)
Eos # K/uL: 0.3 10*3/uL (ref 0–0.45)
Eosinophil %: 5.5 % — ABNORMAL HIGH (ref 0–5)
Hematocrit: 38.5 % — ABNORMAL LOW (ref 42.0–52.0)
Hemoglobin: 13.1 gm/DL (ref 12.0–18.0)
IMM Granulocytes: 0.6 % (ref 0–1)
Immature Granulocytes Absolute: 0.03 10*3/uL
Lymph # K/uL: 1.8 10*3/uL (ref 1.0–4.8)
Lymphocyte %: 36.2 % (ref 25.0–45.0)
MCH: 34 UUG — ABNORMAL HIGH (ref 29–32)
MCHC: 34 g/dl (ref 30–36)
MCV: 100 fl — ABNORMAL HIGH (ref 84.5–98.5)
Mono # K/uL: 0.6 10*3/uL (ref 0–0.80)
Monocyte %: 11.4 % — ABNORMAL HIGH (ref 1.7–9.3)
Neut # K/uL: 2.2 10*3/uL (ref 1.8–7.8)
Nucl RBC # K/uL: 0 10*3/uL (ref 0.0–0.0)
Nucl RBC %: 0 % (ref 0–2)
Platelets: 238 10*3/uL (ref 150–400)
RBC: 3.85 M/uL — ABNORMAL LOW (ref 4.5–6.5)
RDW: 14.8 % — ABNORMAL HIGH (ref 11.5–14.5)
Seg Neut %: 45.1 % (ref 40–60)
WBC: 4.9 10*3/uL — ABNORMAL LOW (ref 5.0–11.0)

## 2020-08-17 NOTE — Progress Notes (Signed)
Pt. arrived to [facility] infusion center ambulatory to chair #6. VS obtained and stable. Orders reviewed. Left chest wall port accessed by writer, positive blood return noted. Labs drawn as ordered. Urine sample obtained. Port flushed per protocol and de accessed. Band aid applied. Patient discharged home, stable and ambulatory. Aware of next scheduled appointment.

## 2020-08-20 MED FILL — Irinotecan HCl Inj 100 MG/5ML (20 MG/ML): INTRAVENOUS | Qty: 20.3 | Status: AC

## 2020-08-20 MED FILL — Bevacizumab-bvzr IV Soln 400 MG/16ML (For Infusion): INTRAVENOUS | Qty: 25.8 | Status: AC

## 2020-08-20 NOTE — Progress Notes (Signed)
Patient presents to [facility] infusion today for D1C4 Zirabev/Irinotecan per MD orders- chart/labs/handoff/consent/dosages reviewed- vitals stable- port accessed using sterile technique by Cyril Mourning RN- pre meds given per MD order- followed by Noah Charon 645mg  over 30 minutes- Irinotecan 406mg  administered over 90 minutes per MD order- patient tolerated procedure well- port flushed with Heparin- port de-accessed- band aid applied- patient has no further appts at this time as he is headed back to [place] for the winter.

## 2020-08-31 ENCOUNTER — Other Ambulatory Visit: Payer: Self-pay | Admitting: Hematology & Oncology

## 2020-09-09 ENCOUNTER — Other Ambulatory Visit: Payer: Self-pay | Admitting: Hematology & Oncology

## 2020-09-25 IMAGING — CT THORAX^CAP_WO_W (ADULT)
1 of 4 series · 11 of 32 positions shown, 17 images · IV contrast (agent unspecified)
Comparison: CT 05/28/2020, 04/26/2020 and 02/02/2020. PET scan of 05/09/2020 is also 
reviewed.

CT CHEST/ABDOMEN AND PELVIS WITH CONTRAST  ([facility]), 09/25/2020 [DATE]: 
(Films were taken at [facility].) 
CLINICAL INDICATION:  History of colon cancer undergoing chemotherapy 
A search for DICOM formatted images was conducted for prior CT imaging studies 
completed at a non-affiliated media free facility.
TECHNIQUE: The region of interest was scanned with 100mL IV contrast on a high 
resolution CT scanner.  Routine MPR reconstructions were performed.

[Series 2: cap w · axial · 0.82mm/px · z∈[-613,-43]mm · 11 of 226 slices shown, 17 images]
[im 18/226  soft-tissue]
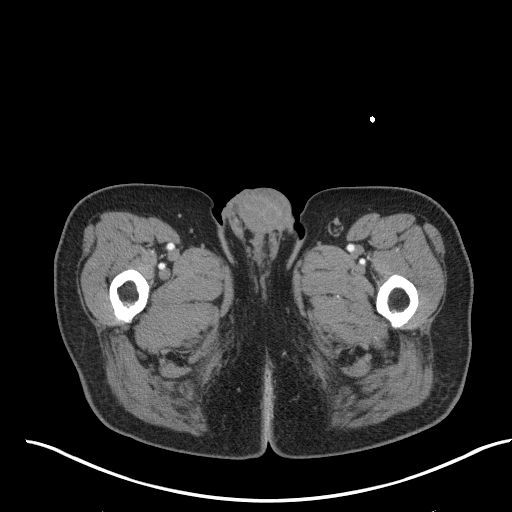
[im 18/226  bone]
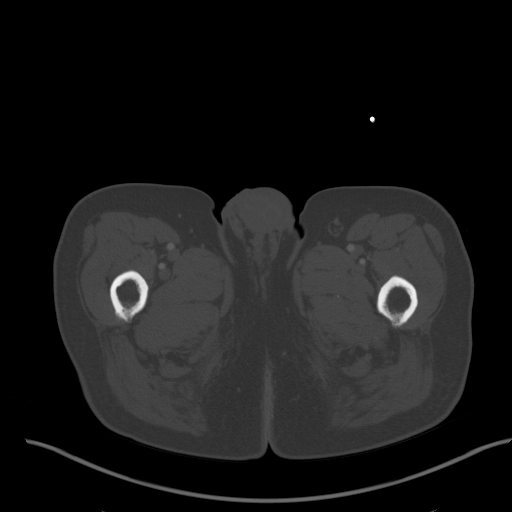
[im 35/226  soft-tissue]
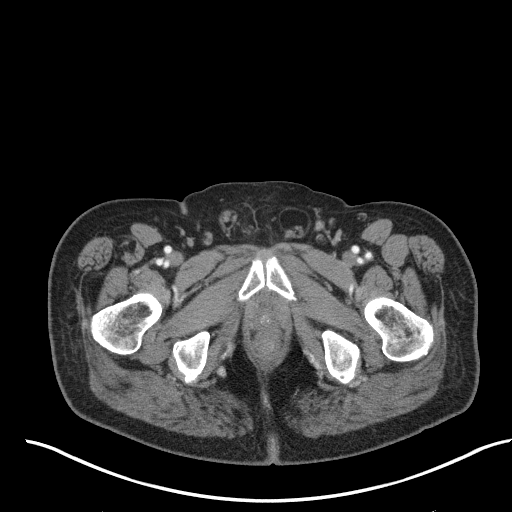
[im 52/226  soft-tissue]
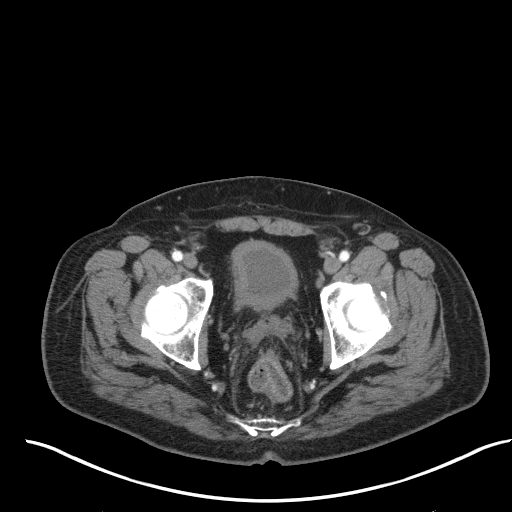
[im 70/226  soft-tissue]
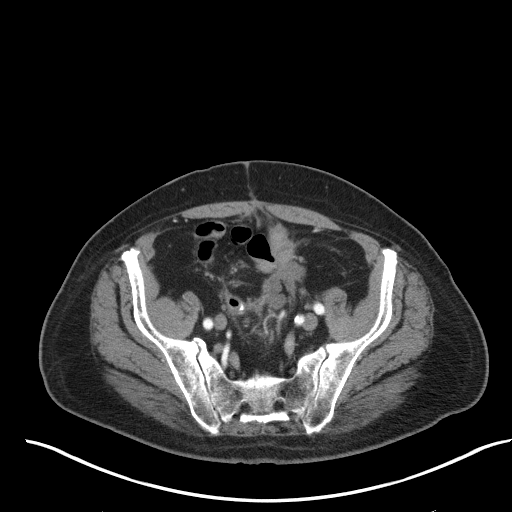
[im 87/226  soft-tissue]
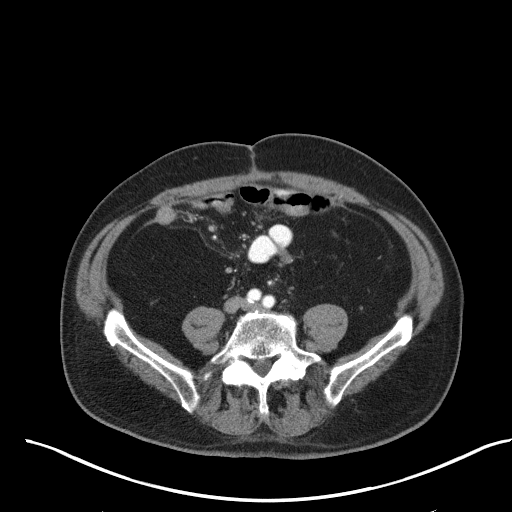
[im 122/226  soft-tissue]
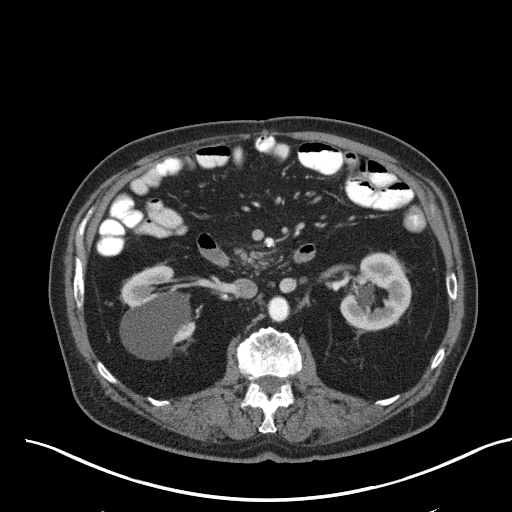
[im 139/226  soft-tissue]
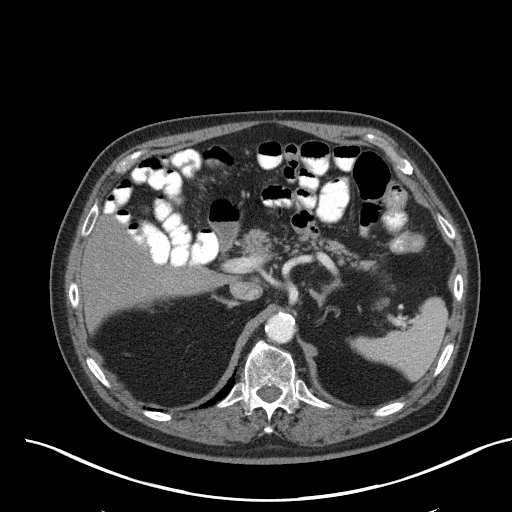
[im 156/226  soft-tissue]
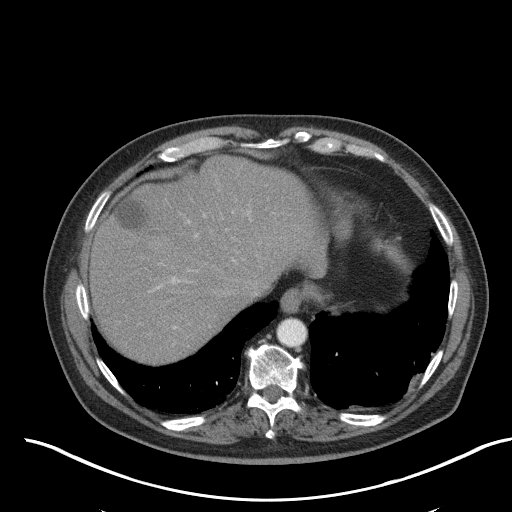
[im 156/226  lung]
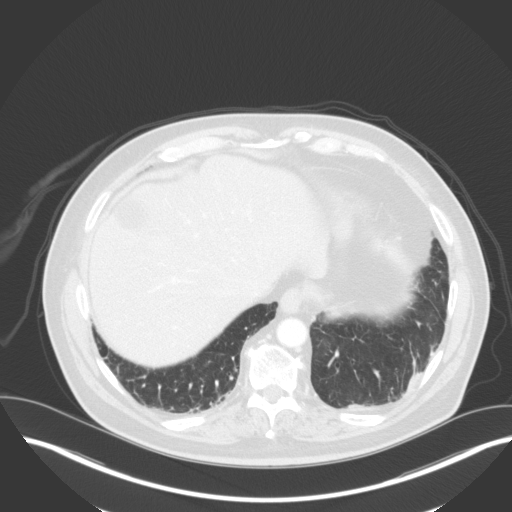
[im 174/226  soft-tissue]
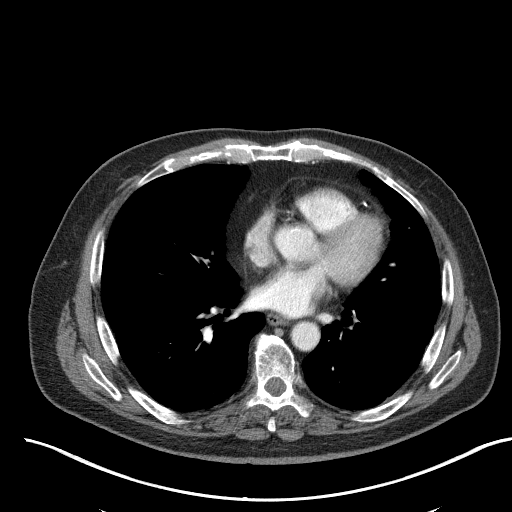
[im 174/226  lung]
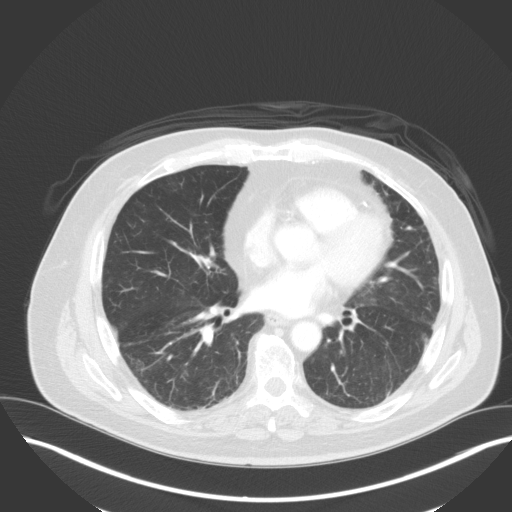
[im 174/226  bone]
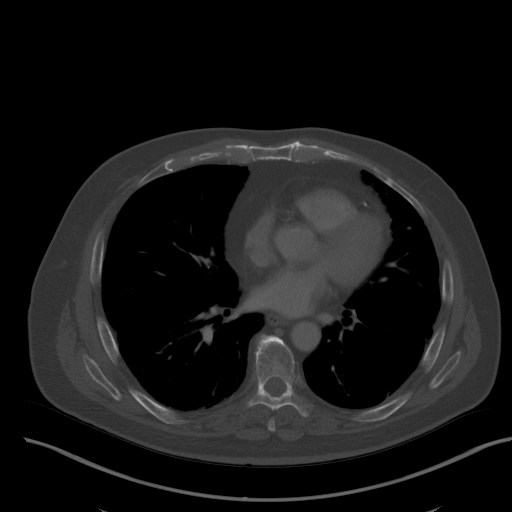
[im 191/226  soft-tissue]
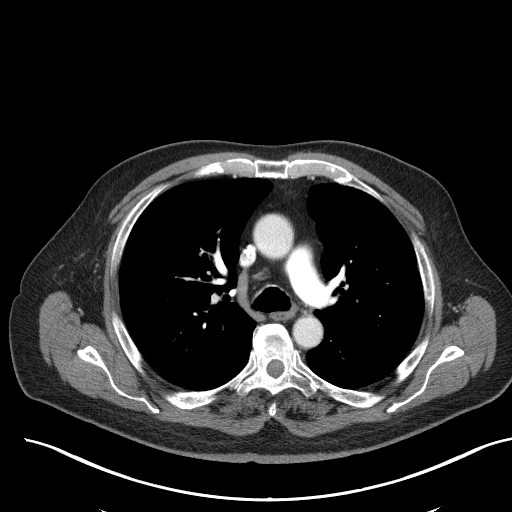
[im 191/226  lung]
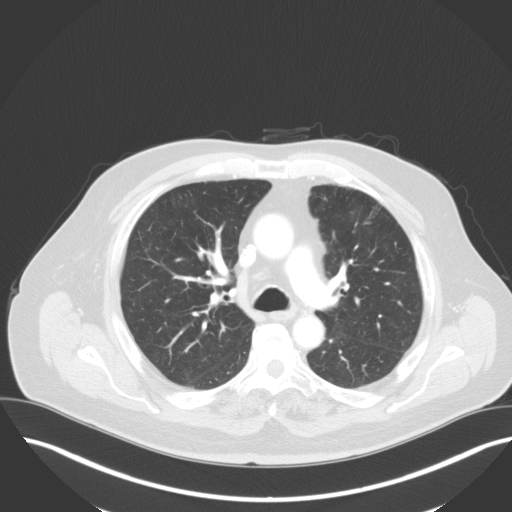
[im 208/226  soft-tissue]
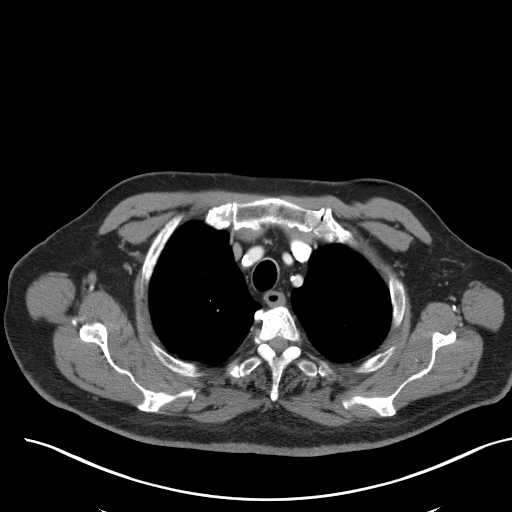
[im 208/226  lung]
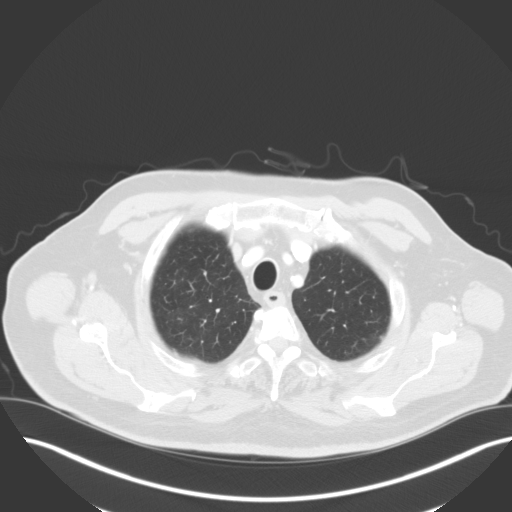

[11 of 32 positions shown; findings below may reference images not displayed]

FINDINGS: LUNGS AND PLEURA:  The FDG avid masslike consolidation involving the left upper 
lobe adjacent to the fissure noted on the prior PET scan and CT has largely 
resolved with minimal residual interstitial change remaining. There are 
scattered areas of subpleural scarring involving both lungs greatest in the 
lower lobes posteriorly in the left upper lobe anteriorly which is unchanged. No 
new infiltrates or consolidation. Calcified granuloma right lung base. 
MEDIASTINUM:  No adenopathy. Normal heart size. No pericardial effusion. 
Moderate coronary artery calcification. 
CHEST WALL/AXILLA: Left chest wall port with catheter tip lower SVC. 
HEPATOBILIARY: Diffuse fatty infiltration of the liver. No focal liver lesions. 
Cholelithiasis. No biliary ductal dilatation. No gallstones. 
SPLEEN: Normal in size. 
PANCREAS: No ductal dilatation or mass. 
ADRENALS: No mass. 
GENITOURINARY: 6.6 x 4.6 cm cyst involving the right mid kidney extending into 
the medullary region. Parapelvic cysts left kidney without hydronephrosis. 
Generalized thickening of the urinary bladder wall. 
LYMPH NODES: No retroperitoneal adenopathy. The lymph node or metastatic implant 
along the left external iliac region remain stable as described below. 
STOMACH, SMALL BOWEL AND COLON: Surgical changes of the bowel with partial 
colectomy. Again noted is the low attenuated multinodular mass like tissue 
encasing the anastomosis at the sigmoid level. The tissue has a low-attenuation 
appearance and may be mucinous in nature. This tracks along the margin of the 
bowel extending anteriorly into the abdominal wall midline. The tissue has an AP 
diameter of approximately 5 cm  the mass visualized on image 161, had measured 5 
cm on the prior CT. The component dissecting anteriorly into the abdominal wall 
also remains unchanged. The multifocal surrounding satellite nodules including 
the largest along the left external iliac region measures 2.2 x 1.3 cm which is 
unchanged. 
VASCULAR STRUCTURES: No aneurysm. 
MUSCULOSKELETAL: Hypertrophic changes thoracic spine. No destructive bone 
lesions. 
ADDITIONAL FINDINGS: No ascites. Prostate gland enlargement.
IMPRESSION: 1. The low-attenuation soft tissue surrounding the sigmoid anastomosis remained 
stable compared to the most recent CT and PET scan dated 04/26/2020 and 05/09/2010. 
The satellite nodules or lymph nodes which have a low-attenuation appearance 
also remains stable. 
2. The FDG avid masslike air consolidation in the left upper lobe seen on the 
prior PET scan has resolved and was likely inflammatory. The other more subtle 
infiltrates in the lungs also cleared residual scarring noted. 
3. Diffuse fatty infiltration of liver. 
4. Cholelithiasis. See additional incidental stable findings above. 
RADIATION DOSE REDUCTION: All CT scans are performed using radiation dose 
reduction techniques, when applicable.  Technical factors are evaluated and 
adjusted to ensure appropriate moderation of exposure.  Automated dose 
management technology is applied to adjust the radiation doses to minimize 
exposure while achieving diagnostic quality images.

## 2020-12-14 IMAGING — CT CT CHEST/ABDOMEN/PELVIS WITH CONTRAST
2 of 5 series · 15 of 46 positions shown, 17 images · IV contrast (APPLIED)
Comparison: Comparison was made to the prior exam(s) within the last 12 months 
09/25/2020

CT CHEST/ABDOMEN/PELVIS WITH CONTRAST, 12/14/2020 [DATE]: 
CLINICAL INDICATION:  Follow-up malignant neoplasm of the sigmoid colon. 
Previous appendectomy and colon resection. 
A search for DICOM formatted images was conducted for prior CT imaging studies 
completed at a non-affiliated media free facility.
TECHNIQUE: The chest, abdomen and pelvis were scanned from base of neck through 
the pubic rami with 100 mL of Isovue 300 injected intravenously on a high 
resolution low dose CT scanner. Routine MPR and MIP 3D renderings were 
reconstructed on an independent workstation with concurrent physician 
supervision.

[Series 4: chest with 2.0 i31s 3 · axial · 0.82mm/px · z∈[-360,-70]mm · 12 of 167 slices shown, 14 images]
[im 11/167  soft-tissue]
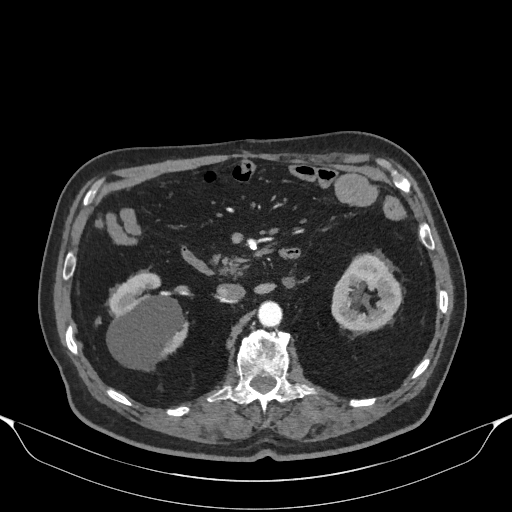
[im 11/167  bone]
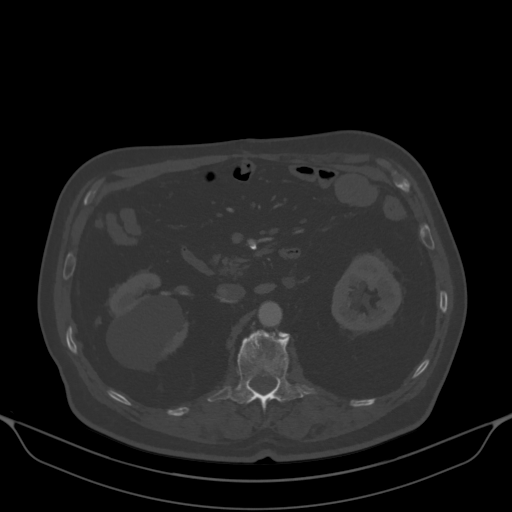
[im 22/167  soft-tissue]
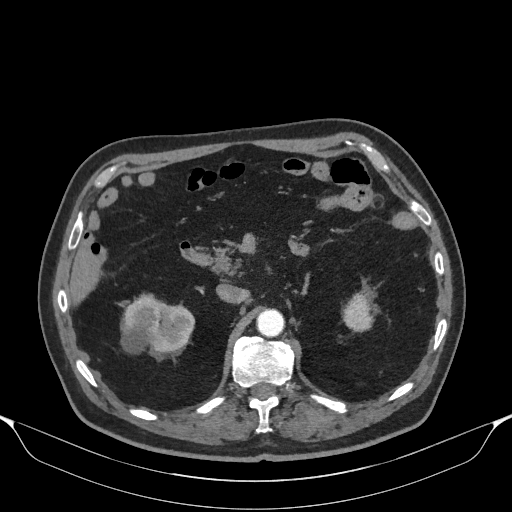
[im 38/167  soft-tissue]
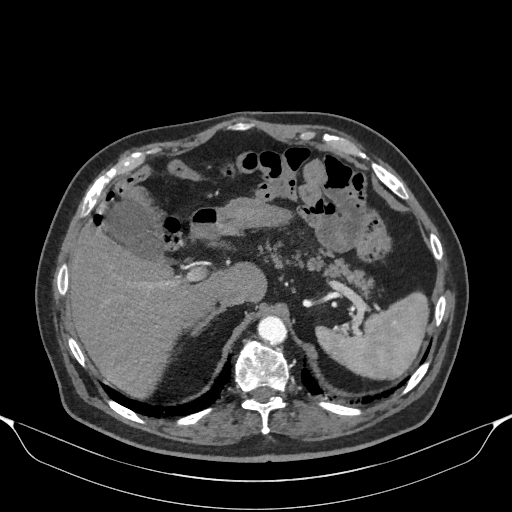
[im 49/167  soft-tissue]
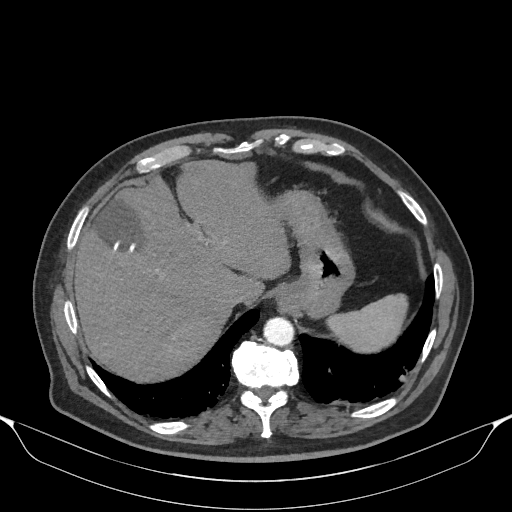
[im 65/167  soft-tissue]
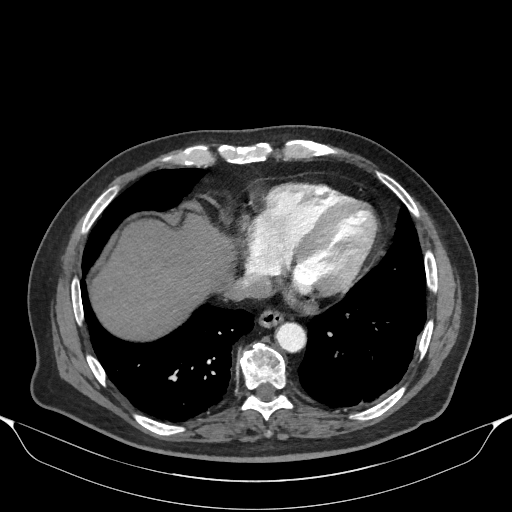
[im 75/167  soft-tissue]
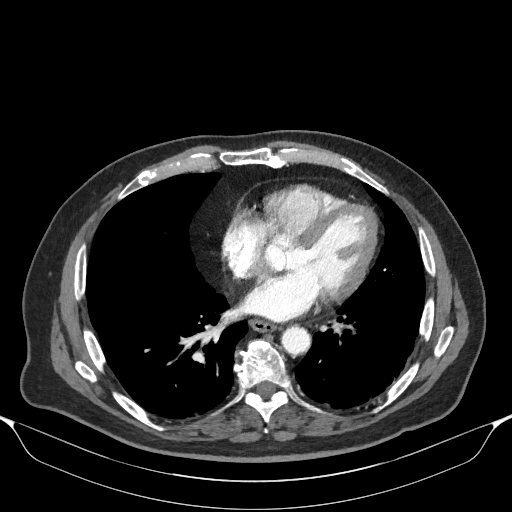
[im 92/167  soft-tissue]
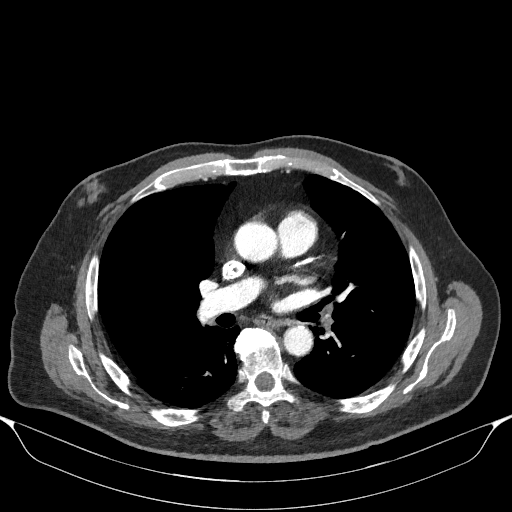
[im 102/167  soft-tissue]
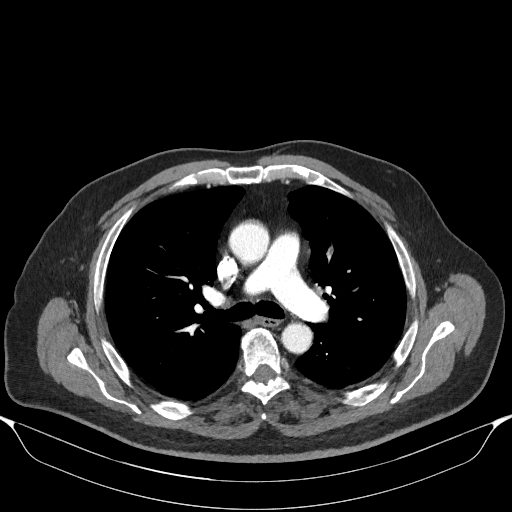
[im 118/167  soft-tissue]
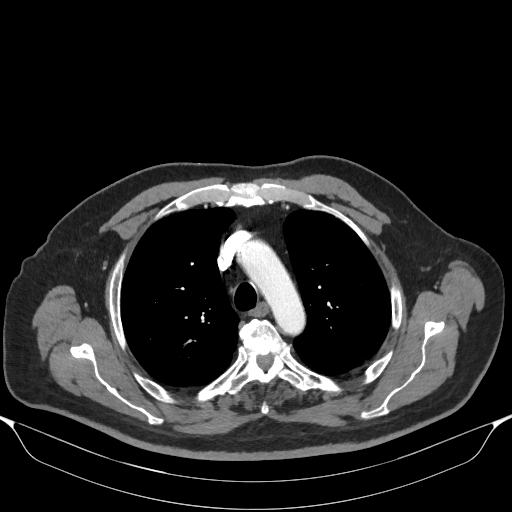
[im 118/167  bone]
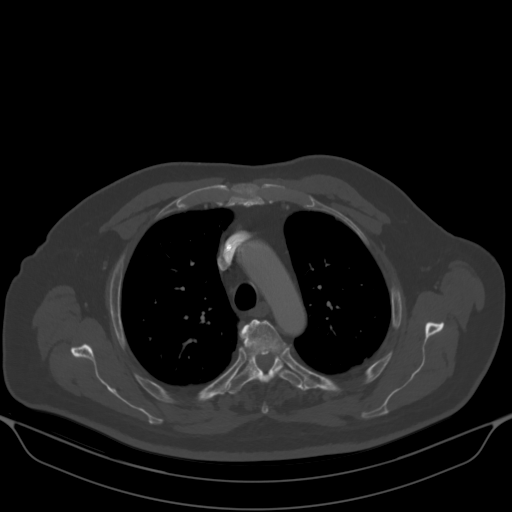
[im 129/167  soft-tissue]
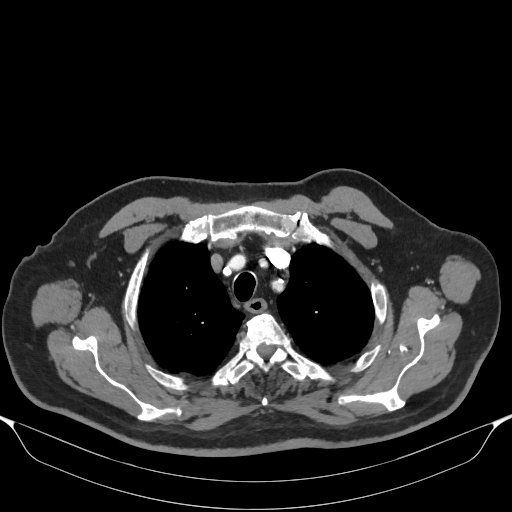
[im 145/167  soft-tissue]
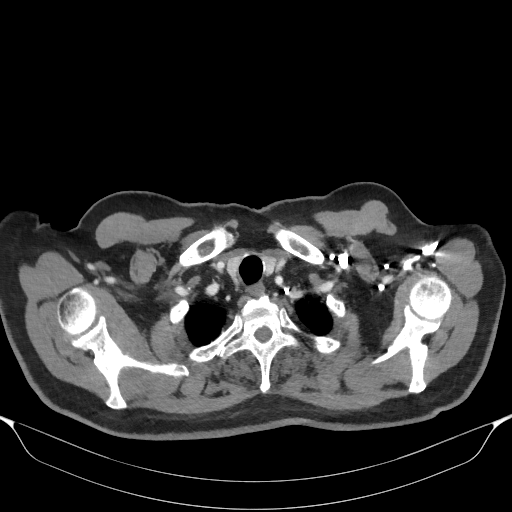
[im 156/167  soft-tissue]
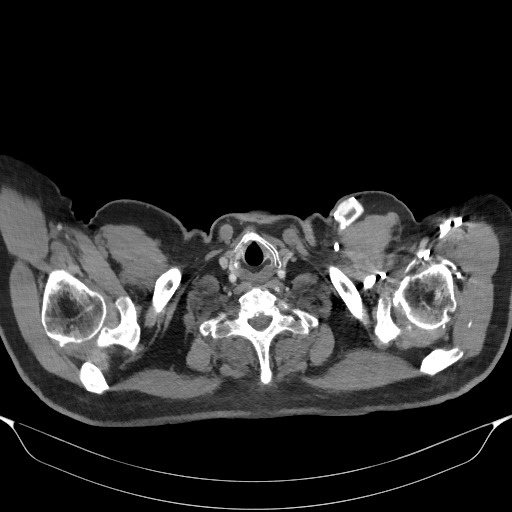

[Series 7: coronal · coronal · 0.69mm/px · 3 of 152 slices shown]
[im 38/152  soft-tissue]
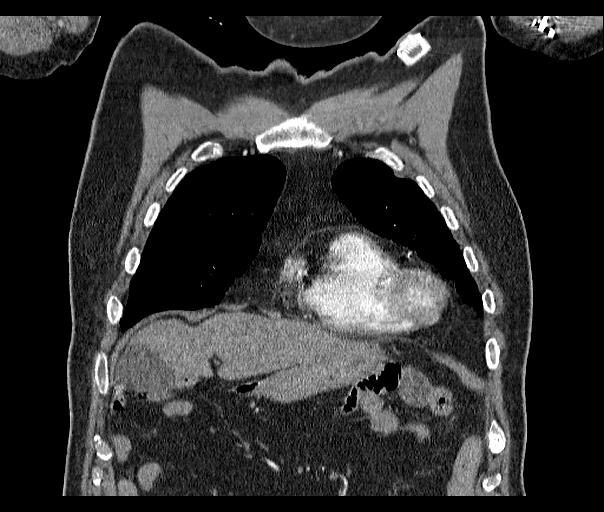
[im 76/152  soft-tissue]
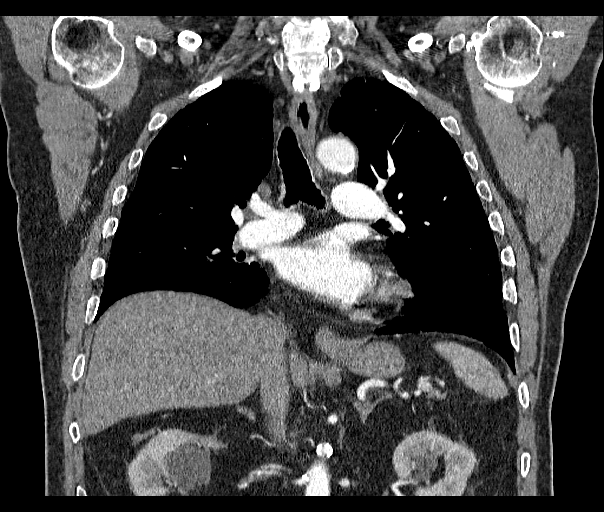
[im 114/152  soft-tissue]
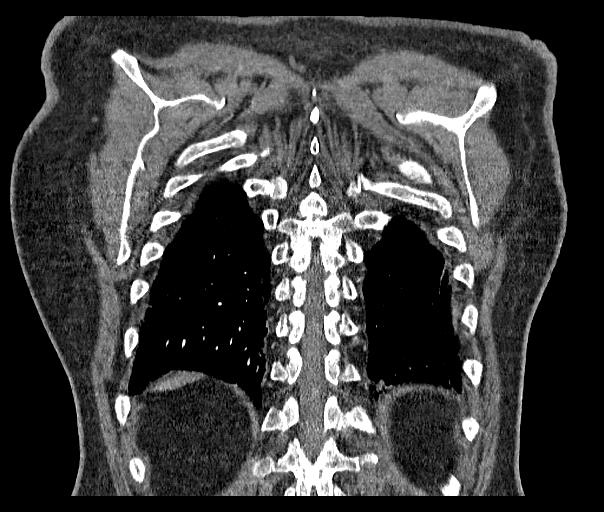

[15 of 46 positions shown; findings below may reference images not displayed]

FINDINGS: LUNGS AND PLEURA:  Stable chronic changes with emphysematous changes and mild 
fibrosis/scarring. No honeycombing or bronchiectasis. No new suspicious lesion 
seen. Stable density in the left posterior sulcus similar to prior exam. 
MEDIASTINUM:  Marked coronary calcifications. No suspicious adenopathy. 
CHEST WALL/AXILLA: Mild gynecomastia. 
HEPATOBILIARY: No evidence for mass or biliary dilatation. Cholelithiasis. 
SPLEEN: Normal in size. 
PANCREAS: No ductal dilatation or mass. 
ADRENALS: No mass. 
GENITOURINARY: No evidence for enhancing mass, stones or hydronephrosis. 
Bilateral renal cyst are identified. Diffuse bladder wall thickening. 
LYMPH NODES: Progressive adenopathy identified. In the left periaortic region 
just above the left renal vein a lymph node is developing. Previously 8 x 5 mm 
currently on image 57 15 x 12 mm. Left para-aortic just below the renal vessels 
lymph node previously 10 x 5 mm currently on image 71 17 x 7 mm. 
STOMACH, SMALL BOWEL AND COLON: Postop changes again involving the sigmoid 
colon. Surrounding soft tissue with areas of low density. This is contiguous 
with loops of bowel as well as the dome of the bladder. This also tracks up 
towards the anterior abdominal wall. Grossly similar to exam from [facility]. Very 
difficult to determine how much of this is simply posttreatment related versus 
residual tumor. 
VASCULAR STRUCTURES: Atherosclerotic changes without aneurysmal dilatation. 
MUSCULOSKELETAL: No acute osseous abnormality. Scattered degenerative changes. 
ADDITIONAL FINDINGS: Enlarged prostate gland..
IMPRESSION: Progressive retroperitoneal adenopathy concerning for residual/progressive 
disease. The actual site of the sigmoid colon is less specific. Very similar to 
exam from [facility] and difficult to measure. PET scan may better define this 
region. 
Stable chronic changes elsewhere. Emphysematous changes, atherosclerotic changes 
and degenerative changes. Cholelithiasis and mild gynecomastia. 
RADIATION DOSE REDUCTION: All CT scans are performed using radiation dose 
reduction techniques, when applicable.  Technical factors are evaluated and 
adjusted to ensure appropriate moderation of exposure.  Automated dose 
management technology is applied to adjust the radiation doses to minimize 
exposure while achieving diagnostic quality images.

## 2021-01-23 IMAGING — CT CT CHEST/ABDOMEN/PELVIS WITH CONTRAST
2 of 5 series · 14 of 46 positions shown, 16 images · IV contrast (APPLIED)
Comparison: 12/14/2020 CT

CT CHEST/ABDOMEN/PELVIS WITH CONTRAST, 01/23/2021 [DATE]: 
CLINICAL INDICATION:  Malignant neoplasm of the sigmoid colon with a solitary 
pulmonary nodule 
A search for DICOM formatted images was conducted for prior CT imaging studies 
completed at a non-affiliated media free facility.
TECHNIQUE: The chest, abdomen and pelvis were scanned from base of neck through 
the pubic rami with 100 mL of Isovue 300 injected intravenously on a high 
resolution low dose CT scanner. Routine MPR and MIP 3D renderings were 
reconstructed on an independent workstation with concurrent physician 
supervision.

[Series 7: coronal · coronal · 0.74mm/px · 3 of 151 slices shown]
[im 38/151  soft-tissue]
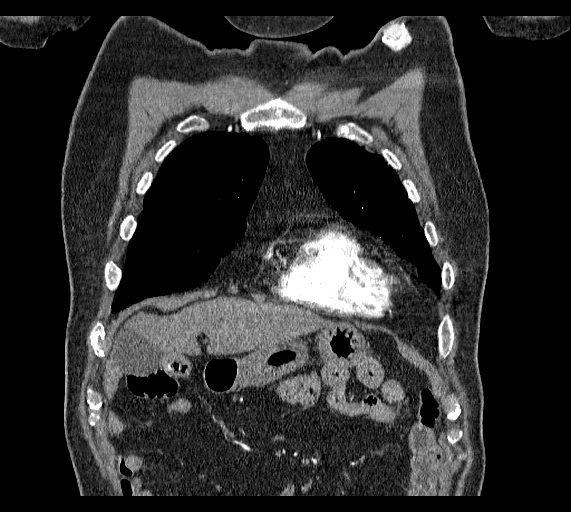
[im 76/151  soft-tissue]
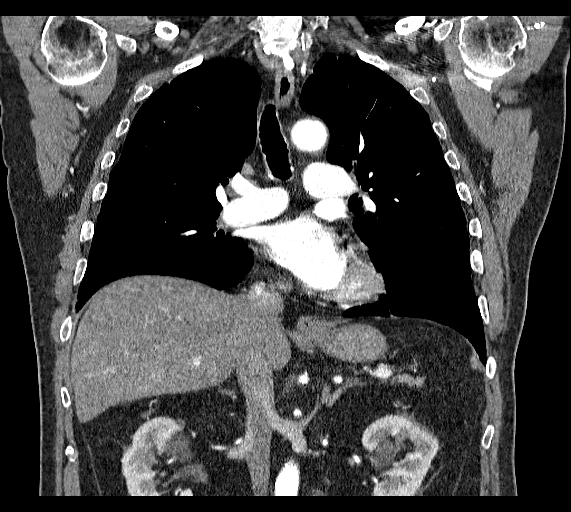
[im 113/151  soft-tissue]
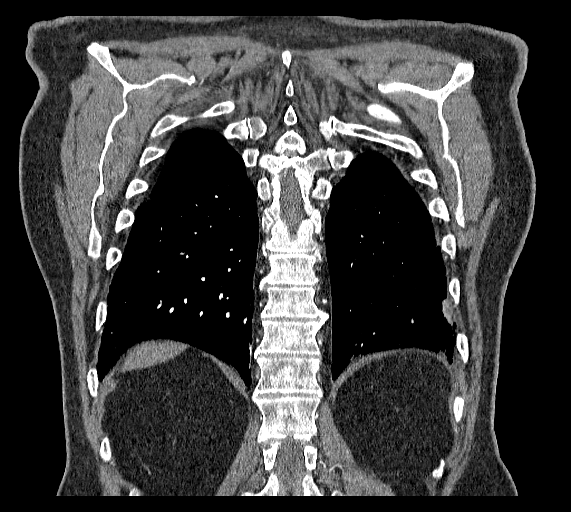

[Series 9: portal 3.0 i41s 2 · axial · portal-venous · 0.73mm/px · z∈[-635,-188]mm · 11 of 173 slices shown, 13 images]
[im 12/173  soft-tissue]
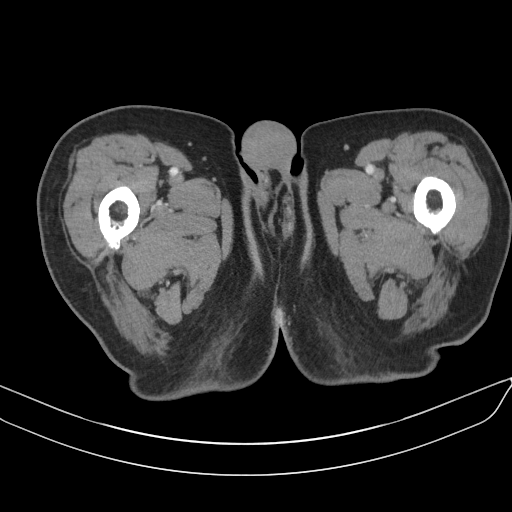
[im 12/173  bone]
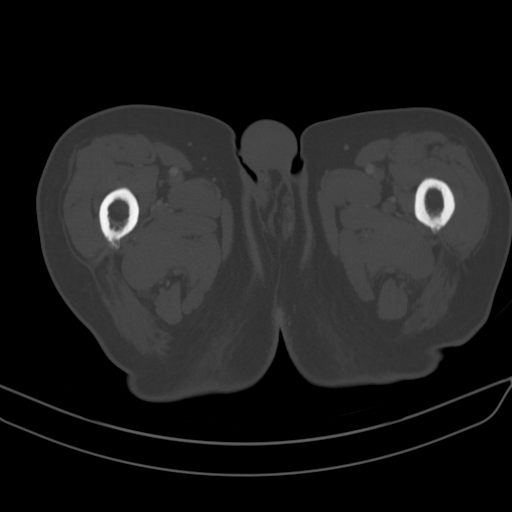
[im 23/173  soft-tissue]
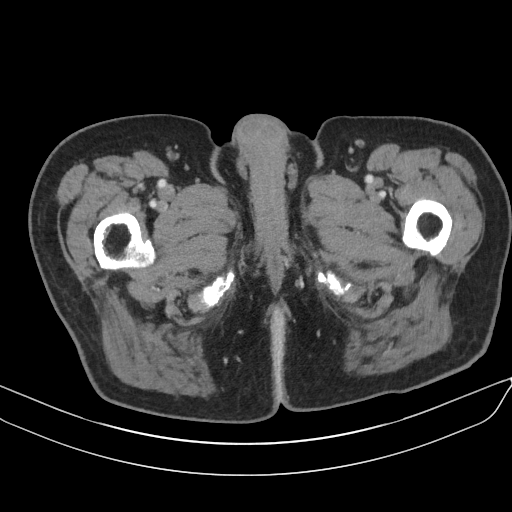
[im 46/173  soft-tissue]
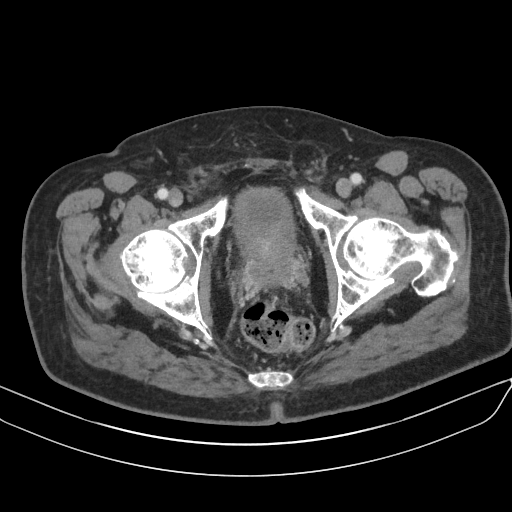
[im 58/173  soft-tissue]
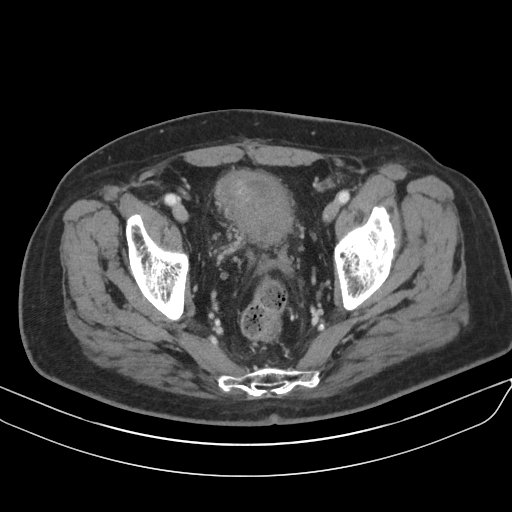
[im 69/173  soft-tissue]
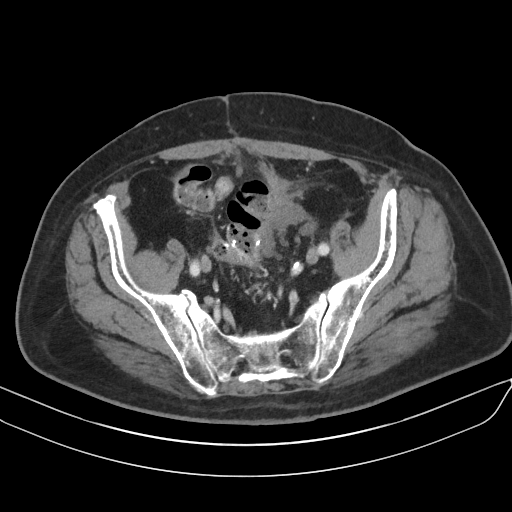
[im 92/173  soft-tissue]
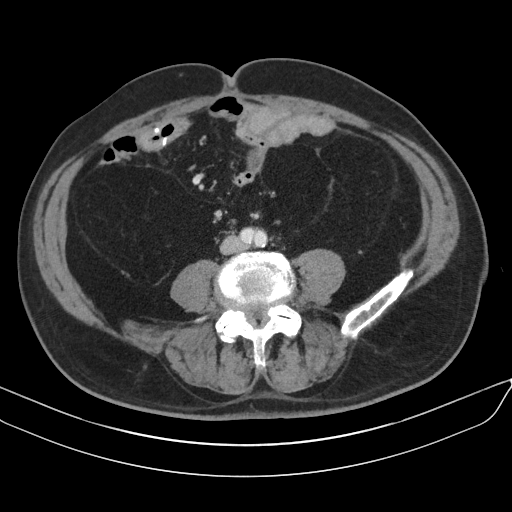
[im 104/173  soft-tissue]
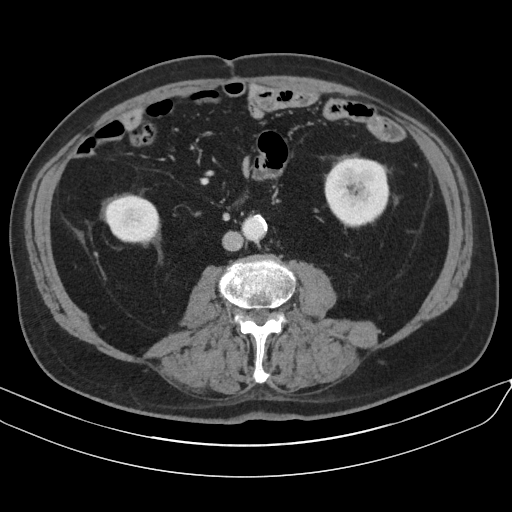
[im 115/173  soft-tissue]
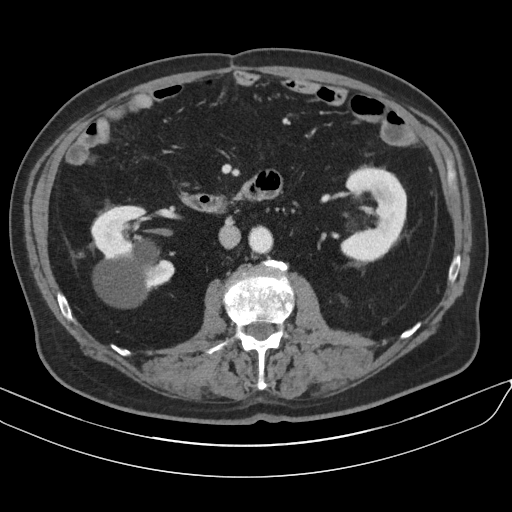
[im 127/173  soft-tissue]
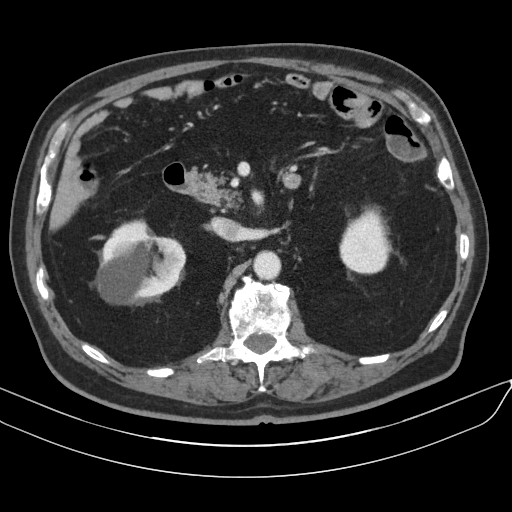
[im 127/173  bone]
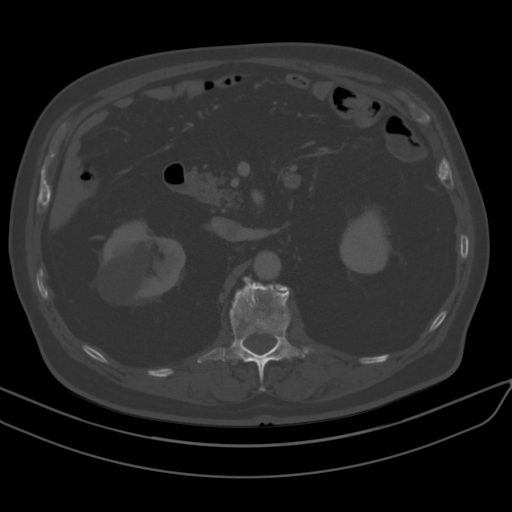
[im 150/173  soft-tissue]
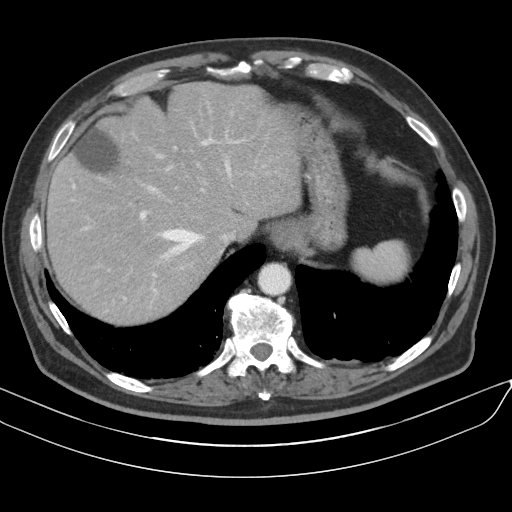
[im 161/173  soft-tissue]
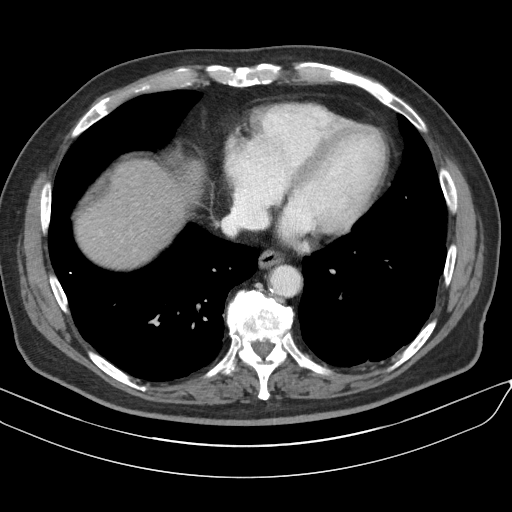

[14 of 46 positions shown; findings below may reference images not displayed]

FINDINGS: LUNGS AND PLEURA:  There is evidence of peripheral abnormal interstitial 
markings in the lung consistent with fibrosis, most evident in the left lung 
base. Severe stenosis of the left lower lobe pulmonary artery is exhibited. This 
is at a site of a prior pulmonary embolism apparent on the CT PET examination 
from 11/22/2019, at which time it appeared relatively acute. 
MEDIASTINUM:  Scattered atherosclerotic vascular calcifications are apparent 
there is dense plaque or a probable coronary artery stent in the proximal left 
anterior descending coronary artery. No evidence of lymphadenopathy in the 
mediastinum is exhibited. No abnormal masses or fluid collections in the 
mediastinum are observed. 
CHEST WALL/AXILLA: No mass or adenopathy. 
HEPATOBILIARY: No evidence for mass or biliary dilatation. There are calcified 
lymph nodes in the gallbladder. The gallbladder wall and surrounding tissues 
appear normal. 
SPLEEN: Normal in size. 
PANCREAS: No ductal dilatation or mass. 
ADRENALS: No mass. 
GENITOURINARY: There are multiple peripelvic cysts in the left kidney. There is 
a large simple appearing cyst in the mid right kidney measuring 4.2 x 6.9 cm. 
There is thickening of the right side of the bladder wall that appears to be 
present at the same location on the most recent comparison examination, but is 
not present on the older study from 04/26/2020. This suggests localized 
postradiation changes in the right side of the urinary bladder, or less likely, 
developing neoplastic process. 
LYMPH NODES: The lymph nodes in the left periaortic region, previously 
described, do not appear to change relative to the most recent comparison 
examination, for example, the node adjacent to the left renal vein and superior 
mesenteric vein measures 13 x 14 mm on the current study. The left iliac lymph 
node currently measures 11 x 22 mm and previously measured 12 x 20 mm. 
STOMACH, SMALL BOWEL AND COLON: There is evidence of a prior partial colectomy. 
Abnormal soft tissue is exhibited adjacent to the distal anastomosis with the 
rectum the appearance of which is likely indicative of recurrent malignancy, but 
it has not changed in its appearance relative to the earlier study, still 
measuring approximately 4.6 cm in length and approximately 18 mm in width. 
VASCULAR STRUCTURES: Mild atherosclerotic vascular calcifications are apparent. 
No additional arterial vascular abnormalities are identified. 
MUSCULOSKELETAL: There is evidence of generalized decreased bone density 
consistent with the patient's advanced age. Spondylosis is exhibited throughout 
the lumbar spine. 
ADDITIONAL FINDINGS: None.
IMPRESSION: The developing lymphadenopathy appear stable relative to the recent comparison 
examination. The abnormal soft tissue adjacent to the distal colon anastomosis 
is also unchanged in its appearance, but is most consistent with a local 
recurrence. 
The right side of the urinary bladder wall is increased in thickness on both 
recent examinations. This may be secondary to postradiation changes, but a 
developing neoplastic process involving the right side of the urinary bladder 
cannot be excluded. 
Peripheral interstitial scarring is exhibited in the lung. There is a marked 
stenosis of the main left lower lobe pulmonary artery at the site of a prior 
pulmonary embolism. 
Cholelithiasis 
Atherosclerosis 
Degenerative disc disease and spondylosis. 
Bilateral renal cysts. 
RADIATION DOSE REDUCTION: All CT scans are performed using radiation dose 
reduction techniques, when applicable.  Technical factors are evaluated and 
adjusted to ensure appropriate moderation of exposure.  Automated dose 
management technology is applied to adjust the radiation doses to minimize 
exposure while achieving diagnostic quality images.

## 2021-04-03 ENCOUNTER — Other Ambulatory Visit: Payer: Self-pay | Admitting: Gastroenterology

## 2021-04-03 IMAGING — CT CT BRAIN WITHOUT CONTRAST
3 series · 15 of 47 positions shown, 18 images · non-contrast
Comparison: No prior cranial examination

CT BRAIN WITHOUT CONTRAST, 04/03/2021 [DATE]: 
CLINICAL INDICATION:  Severe dizziness and weakness, recent head trauma 
A search for DICOM formatted images was conducted for prior CT imaging studies 
completed at a non-affiliated media free facility.
TECHNIQUE: The head was scanned from vertex through skull base without contrast 
on a high resolution CT scanner using dose reduction techniques. Routine MPR 
reconstructions were performed.

[Series 2: head 3.0 j30s 1 · axial · 0.48mm/px · z∈[-164,-14]mm · 9 of 58 slices shown, 12 images]
[im 4/58  brain]
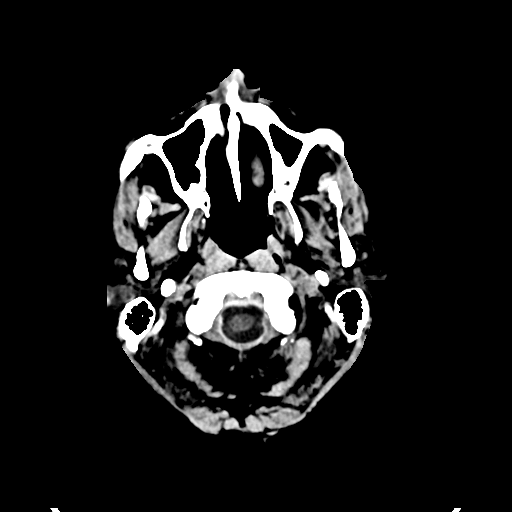
[im 4/58  bone]
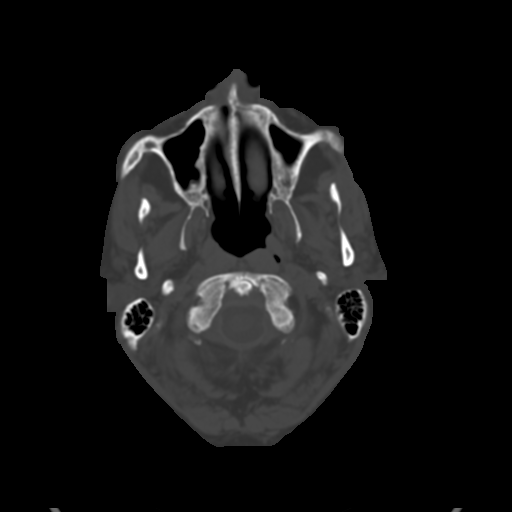
[im 10/58  brain]
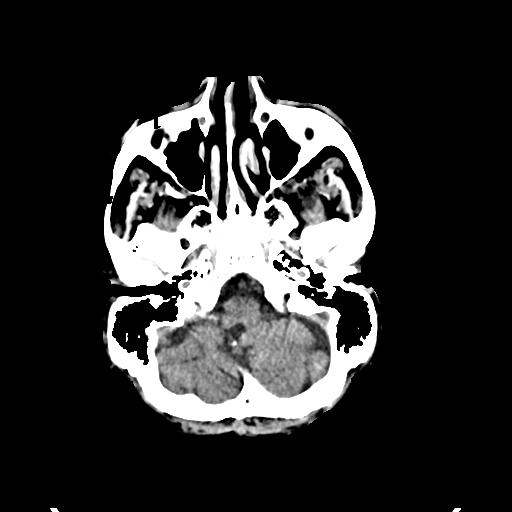
[im 16/58  brain]
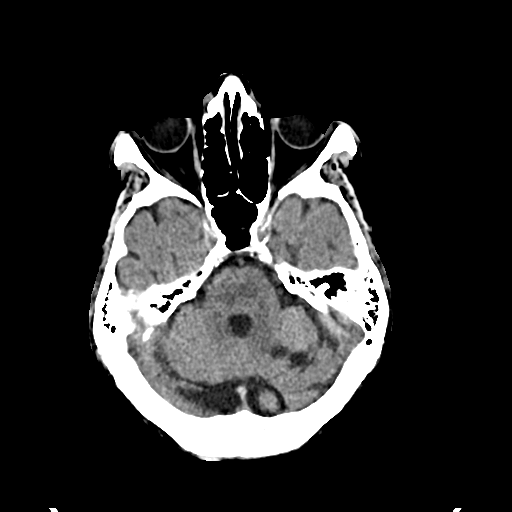
[im 22/58  brain]
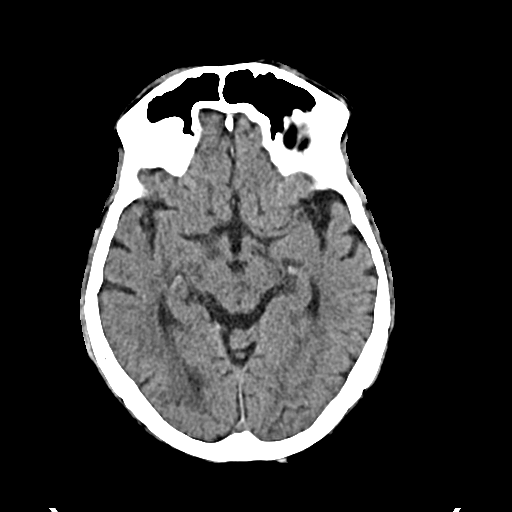
[im 30/58  brain]
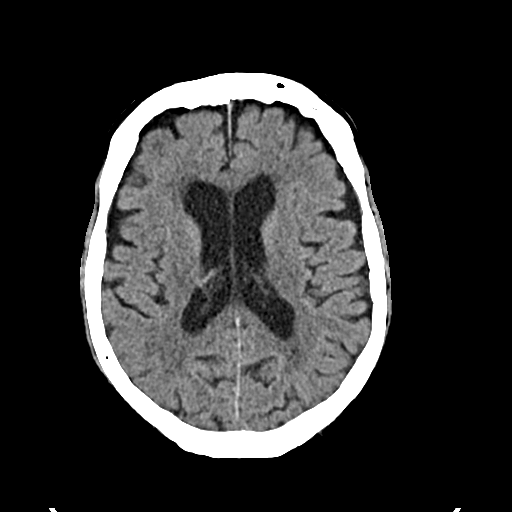
[im 30/58  bone]
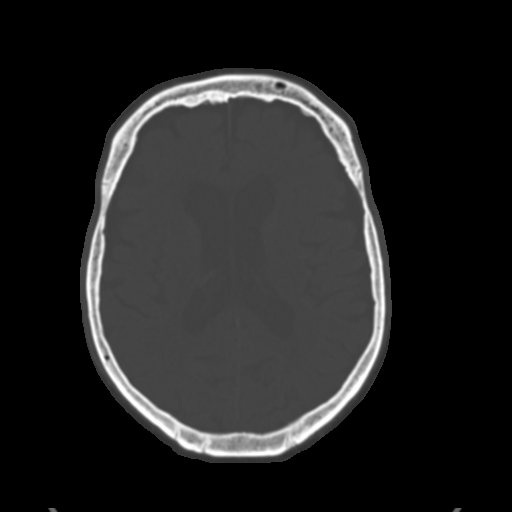
[im 36/58  brain]
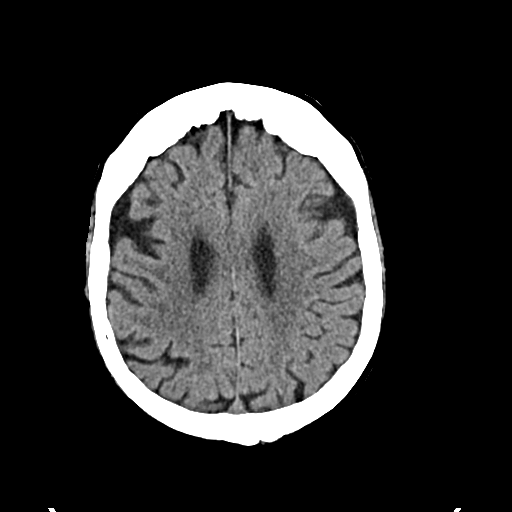
[im 42/58  brain]
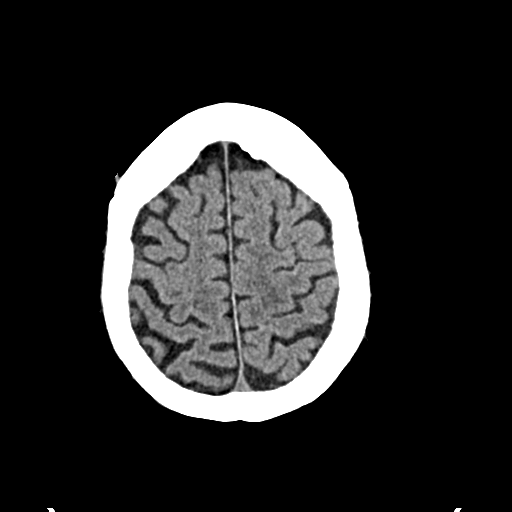
[im 48/58  brain]
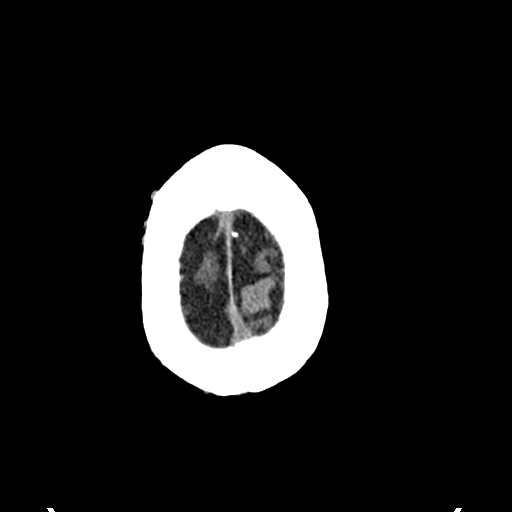
[im 54/58  brain]
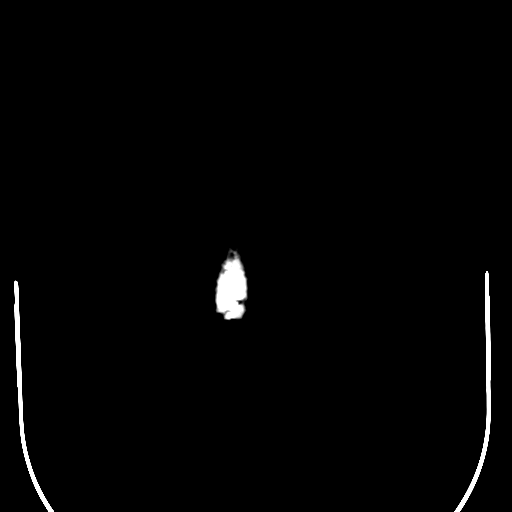
[im 54/58  bone]
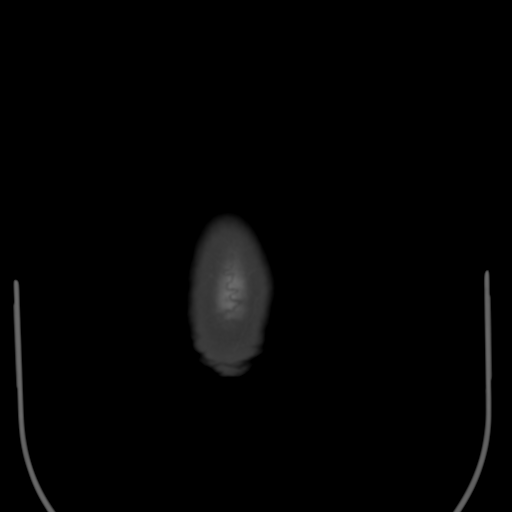

[Series 4: coronal · coronal · 0.37mm/px · 3 of 77 slices shown]
[im 26/77  brain]
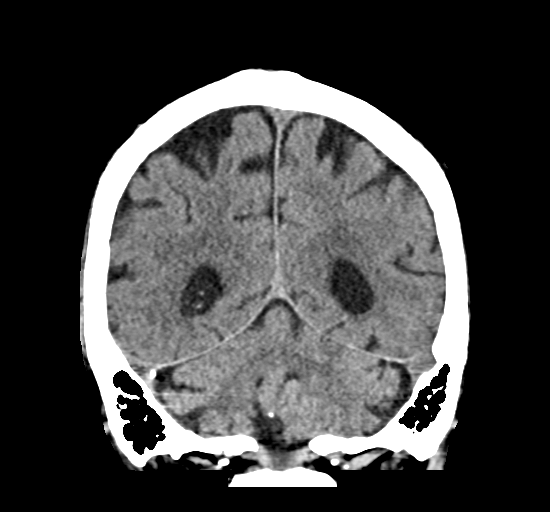
[im 34/77  brain]
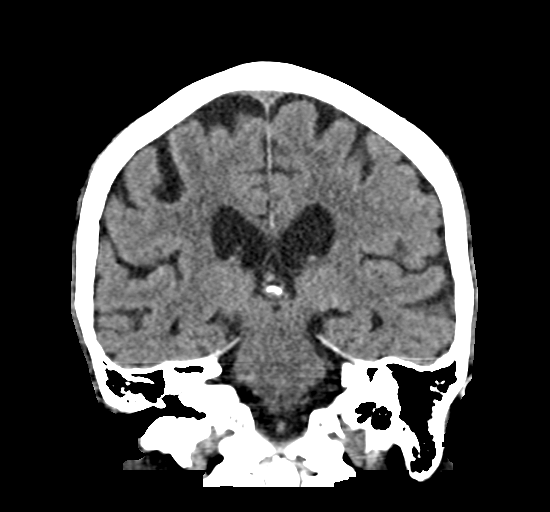
[im 43/77  brain]
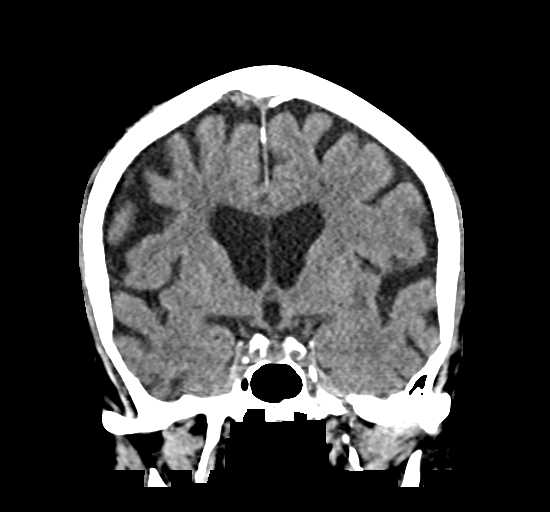

[Series 5: sagittal · sagittal · 0.34mm/px · 3 of 63 slices shown]
[im 21/63  brain]
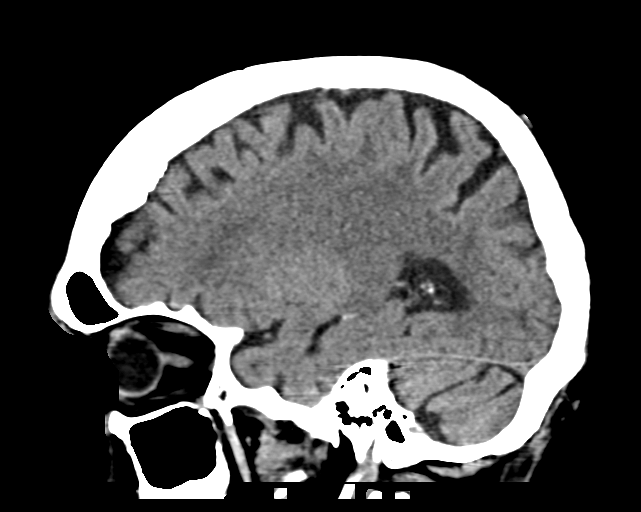
[im 32/63  brain]
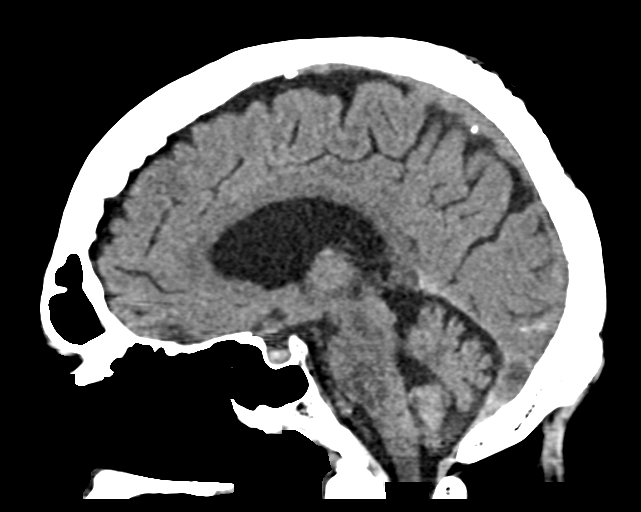
[im 42/63  brain]
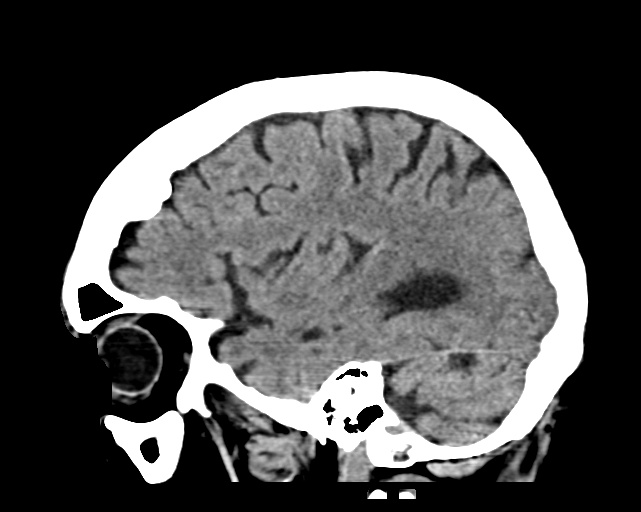

[15 of 47 positions shown; findings below may reference images not displayed]

FINDINGS: There is moderate cortical atrophy. There is hyperostosis frontalis 
interna. Ventricular enlargement is relatively proportionate. There is no 
evidence for recent segmental infarct, hemorrhage or intracranial mass. There is 
a chronic left cerebellar infarct extending 1.9 cm. There is no brainstem 
lesion. Calvarium is intact. Sinuses and mastoids appear clear.
IMPRESSION: No acute intracranial findings. No intracranial hemorrhage. 
Atrophy with proportionate ventricular enlargement. Chronic left cerebellar 
infarct. 
RADIATION DOSE REDUCTION: All CT scans are performed using radiation dose 
reduction techniques, when applicable.  Technical factors are evaluated and 
adjusted to ensure appropriate moderation of exposure.  Automated dose 
management technology is applied to adjust the radiation doses to minimize 
exposure while achieving diagnostic quality images.

## 2021-04-05 ENCOUNTER — Other Ambulatory Visit: Payer: Self-pay | Admitting: Gastroenterology

## 2021-04-05 IMAGING — CT CT BRAIN WITHOUT CONTRAST  ([facility]CS)
1 series · 15 of 30 positions shown, 19 images · non-contrast
Comparison: None

CT BRAIN WITHOUT CONTRAST  ([facility]), 04/05/2021 [DATE]: 
(Films were taken at [facility].) 
CLINICAL INDICATION:  Colon cancer. 
A search for DICOM formatted images was conducted for prior CT imaging studies 
completed at a non-affiliated media free facility.
TECHNIQUE: The region of interest was scanned without on a high resolution CT 
scanner.  Routine MPR reconstructions were performed. 
EXTRAAXIAL SPACE: Ventricles appear age appropriate and maintain midline 
position.  No collections or mass lesion. Mild atrophy and aging changes of the 
brain. 
CEREBRUM: Superficial and deep cortical structures are well formed. No focal 
attenuation abnormality or mass effect present. There are mild periventricular 
white matter microvascular ischemic changes present 
CEREBELLUM:  Cerebellar hemispheres and vermis are well formed without mass 
lesion or focal attenuation abnormality. Basal cisterns are 
maintained. 
BRAINSTEM:  Midbrain, pons, and medulla are well formed without mass or definite 
focal attenuation abnormality allowing for skull base artifacts. 
SKULL/EXTRACRANIAL STRUCTURES: Minimal mucosal thickening of the left maxillary 
sinus, otherwise Bilateral paranasal sinuses are clear. Mastoid air cells and 
middle ear cavity without significant disease. Pituitary fossa and orbits 
without definite mass. Calvarium and visualized skull base appear unremarkable. 
Contrast enhanced views are negative for pathologic enhancement.

[Series 5: brain [id number] · axial · 0.42mm/px · z∈[-16,+130]mm · 15 of 33 slices shown, 19 images]
[im 2/33  brain]
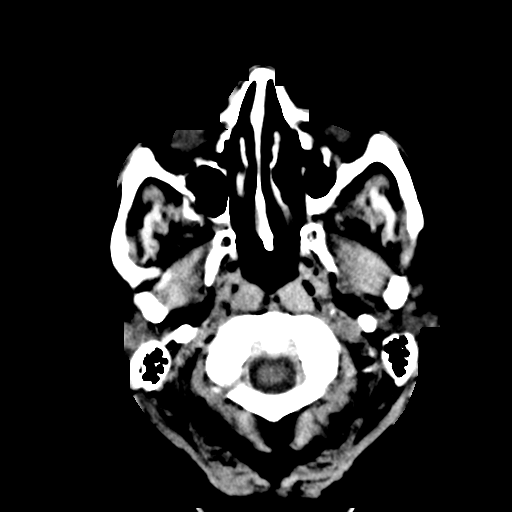
[im 2/33  bone]
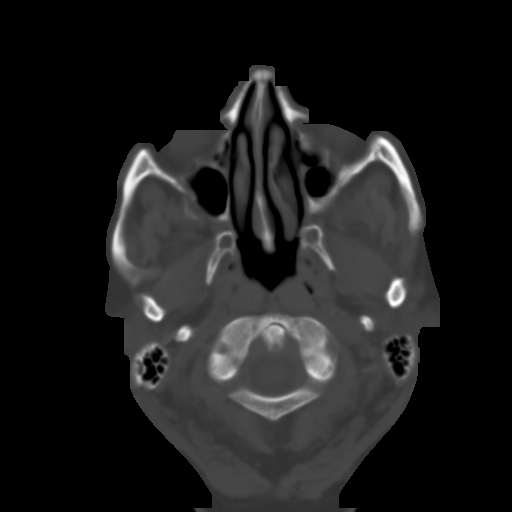
[im 4/33  brain]
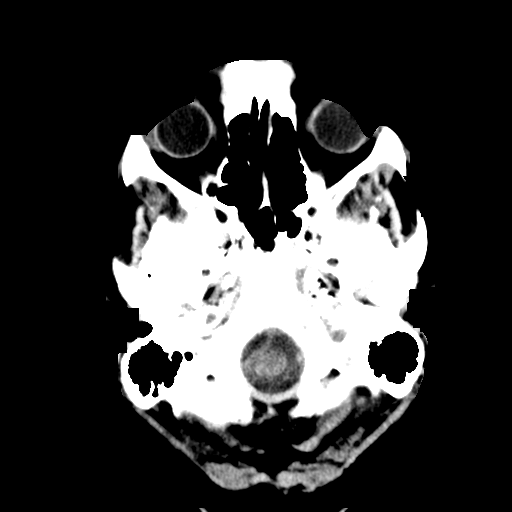
[im 6/33  brain]
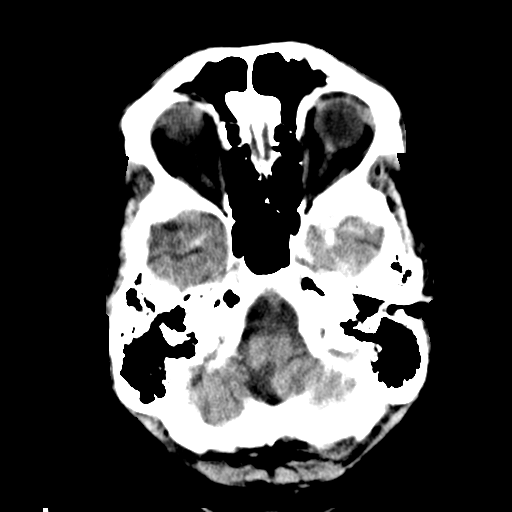
[im 8/33  brain]
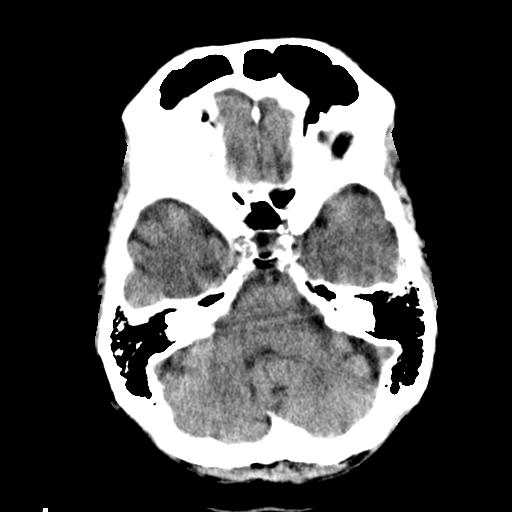
[im 10/33  brain]
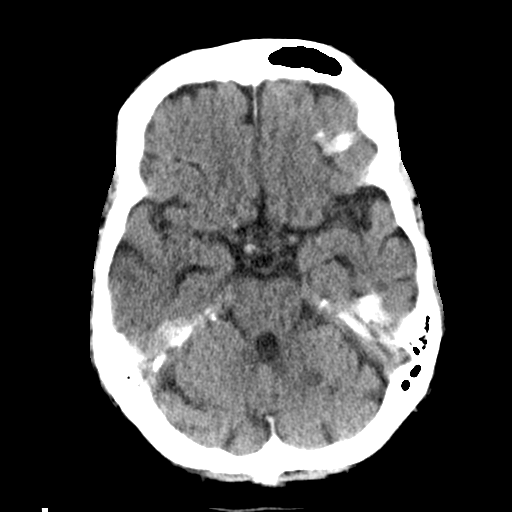
[im 10/33  bone]
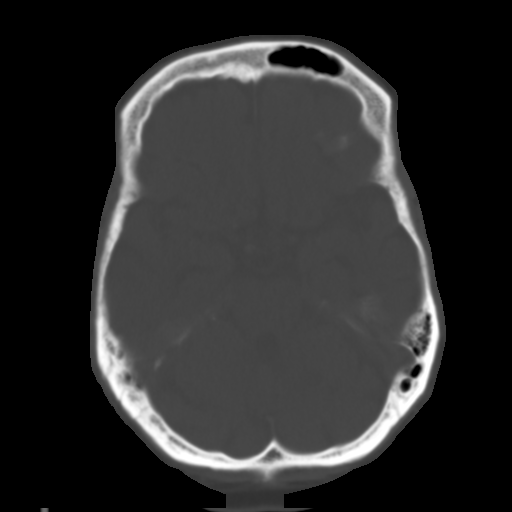
[im 13/33  brain]
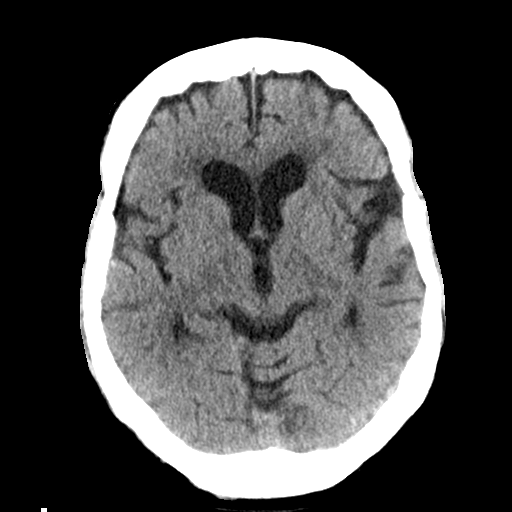
[im 15/33  brain]
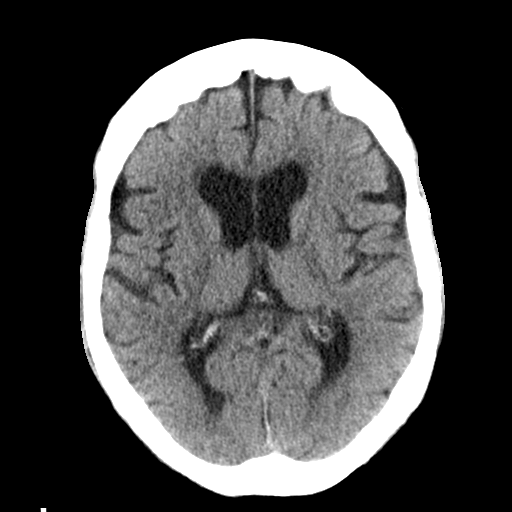
[im 17/33  brain]
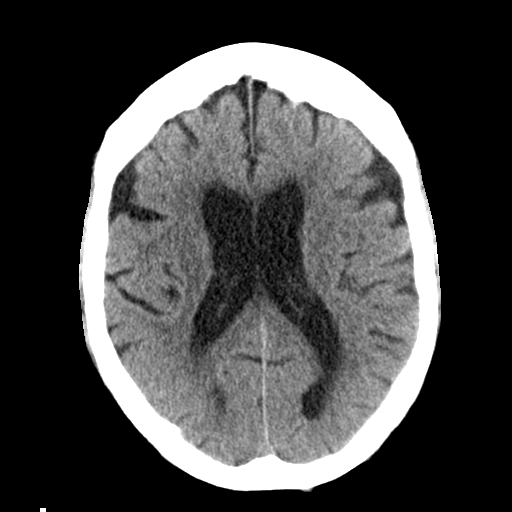
[im 18/33  brain]
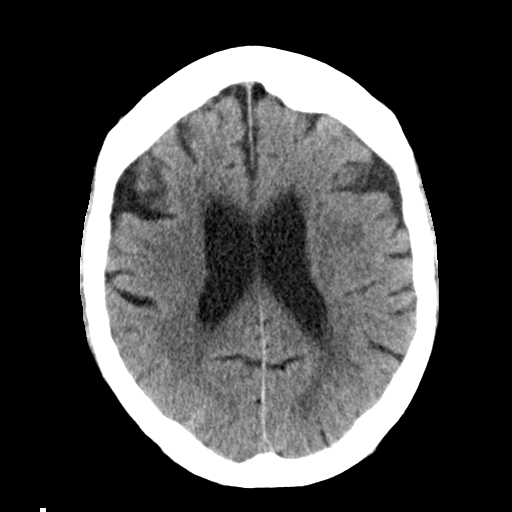
[im 18/33  bone]
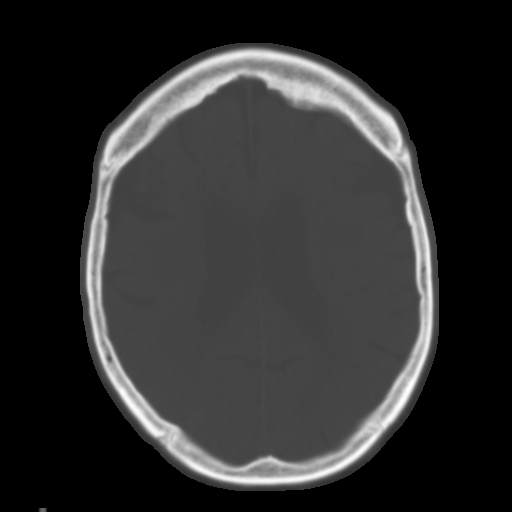
[im 20/33  brain]
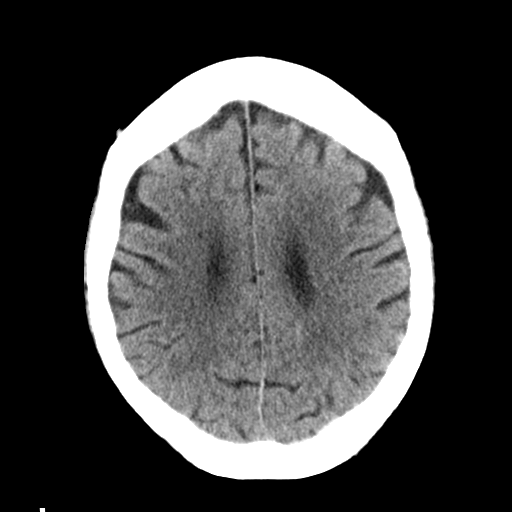
[im 23/33  brain]
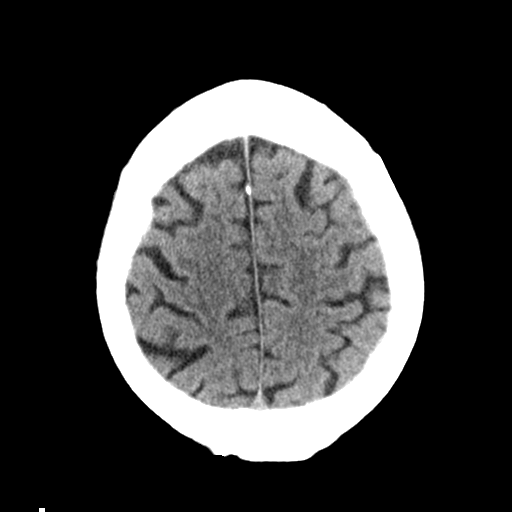
[im 25/33  brain]
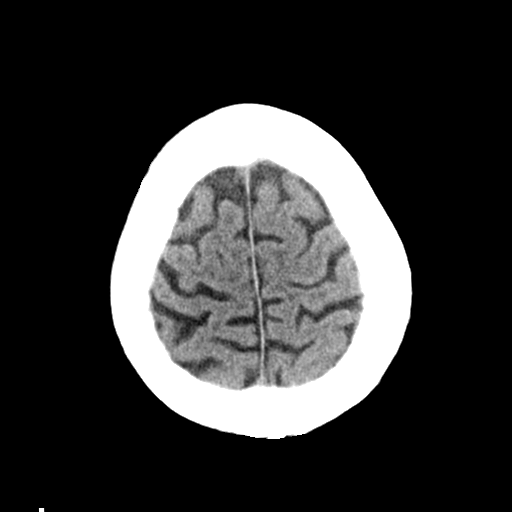
[im 27/33  brain]
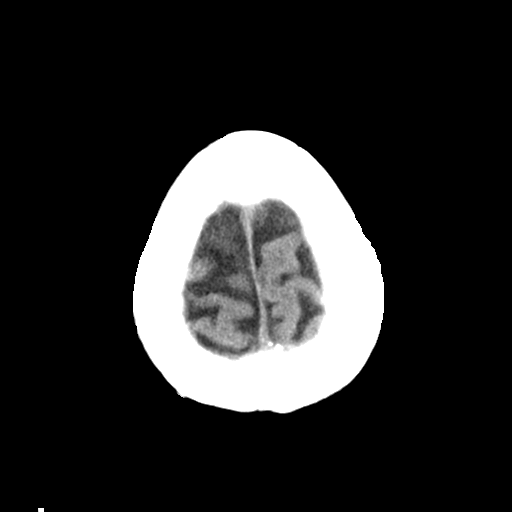
[im 27/33  bone]
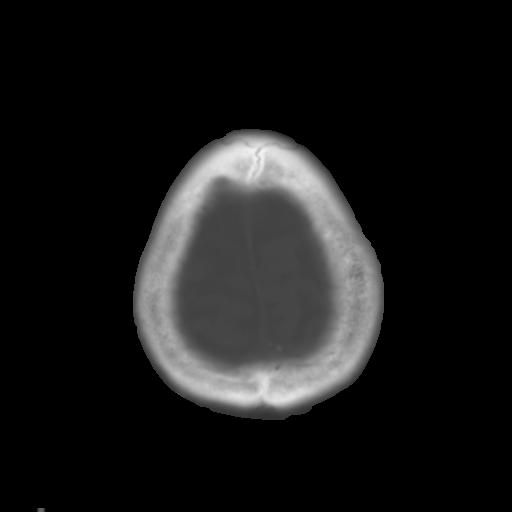
[im 29/33  brain]
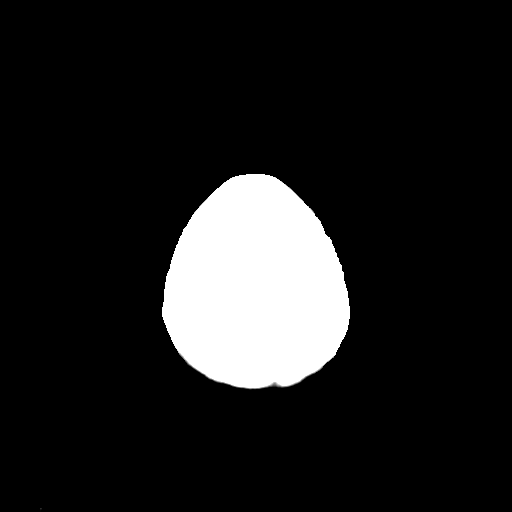
[im 31/33  brain]
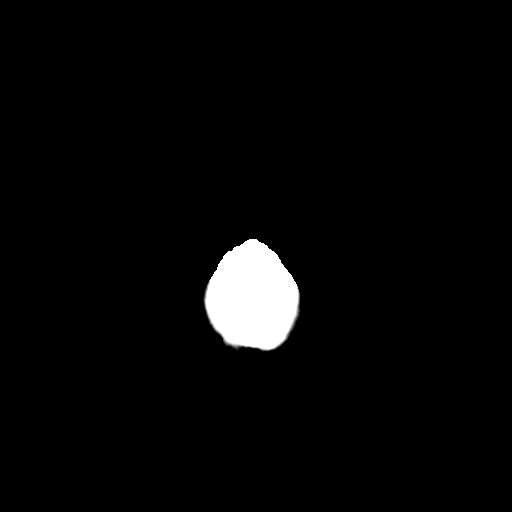

[15 of 30 positions shown; findings below may reference images not displayed]

IMPRESSION: Mild age-appropriate atrophy and aging changes of the brain. Minimal focal 
thickening in the left maxillary sinus. No mass, hemorrhage or CT evidence for 
acute infarction. 
RADIATION DOSE REDUCTION: All CT scans are performed using radiation dose 
reduction techniques, when applicable.  Technical factors are evaluated and 
adjusted to ensure appropriate moderation of exposure.  Automated dose 
management technology is applied to adjust the radiation doses to minimize 
exposure while achieving diagnostic quality images.

## 2021-04-05 IMAGING — CT CT CHEST/ABDOMEN AND PELVIS WITH CONTRAST  ([facility])
1 of 3 series · 13 of 32 positions shown, 18 images · IV contrast (agent unspecified)
Comparison: 01/23/2021

CT CHEST/ABDOMEN AND PELVIS WITH CONTRAST  ([facility]), 04/05/2021 [DATE]: 
(Films were taken at [facility].) 
CLINICAL INDICATION:  Metastatic colon cancer with increasing CEA. 
A search for DICOM formatted images was conducted for prior CT imaging studies 
completed at a non-affiliated media free facility.
TECHNIQUE: The region of interest was scanned with 100 mL IV contrast on a high 
resolution CT scanner.  Routine MPR reconstructions were performed.

[Series 2: cap w · axial · 0.87mm/px · z∈[+657,+1287]mm · 13 of 240 slices shown, 18 images]
[im 15/240  soft-tissue]
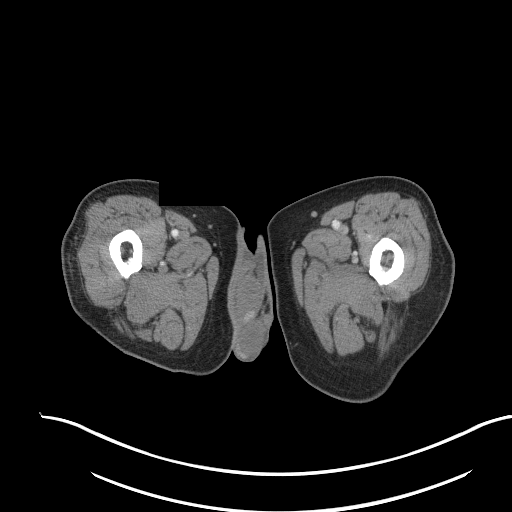
[im 15/240  bone]
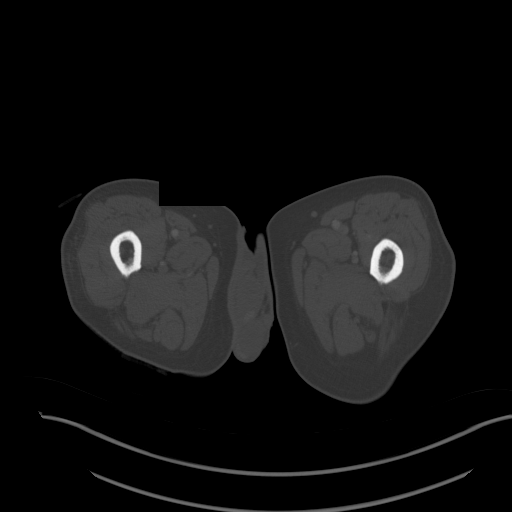
[im 43/240  soft-tissue]
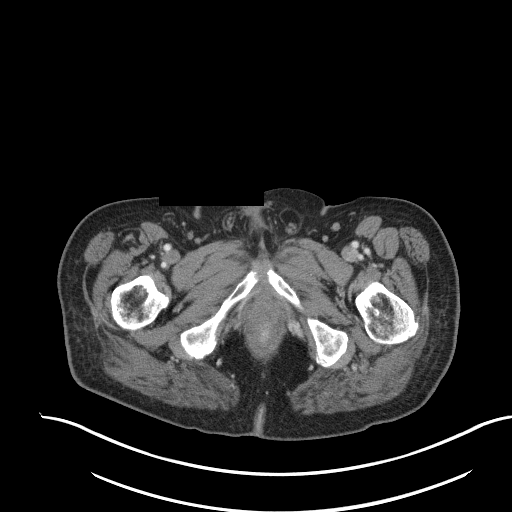
[im 57/240  soft-tissue]
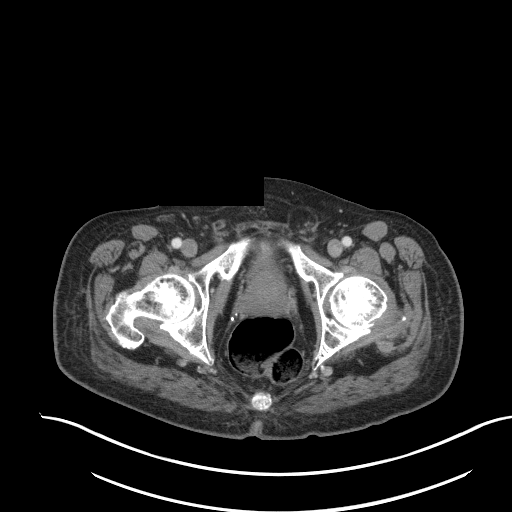
[im 71/240  soft-tissue]
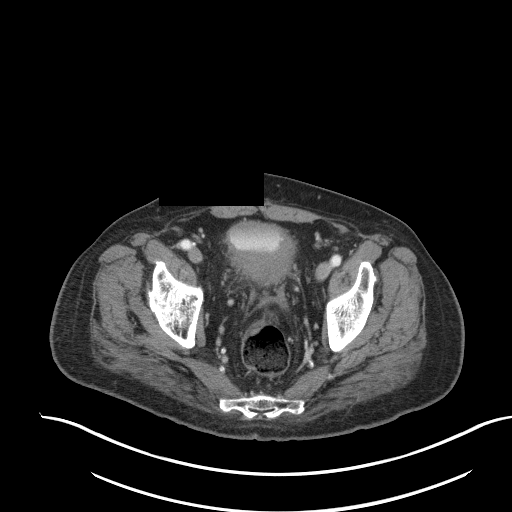
[im 99/240  soft-tissue]
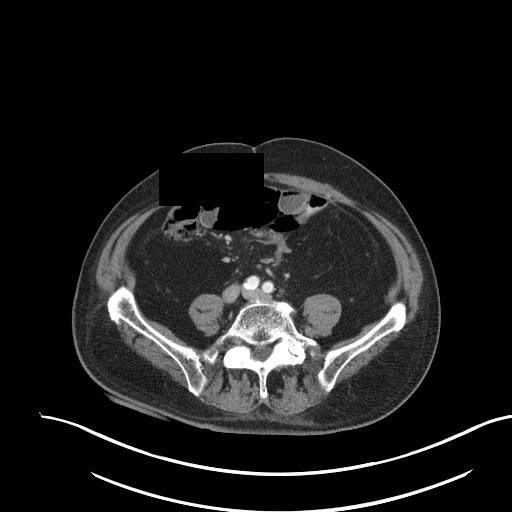
[im 113/240  soft-tissue]
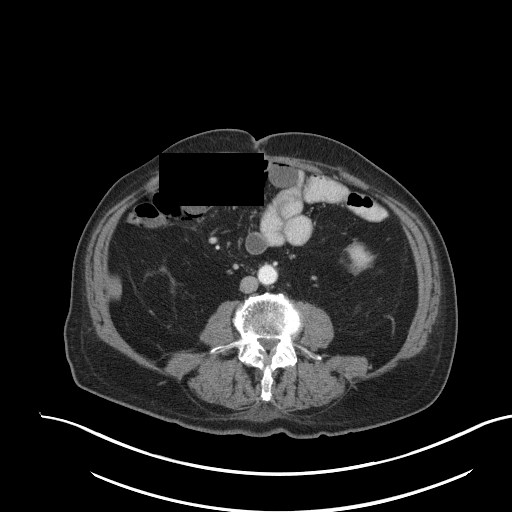
[im 127/240  soft-tissue]
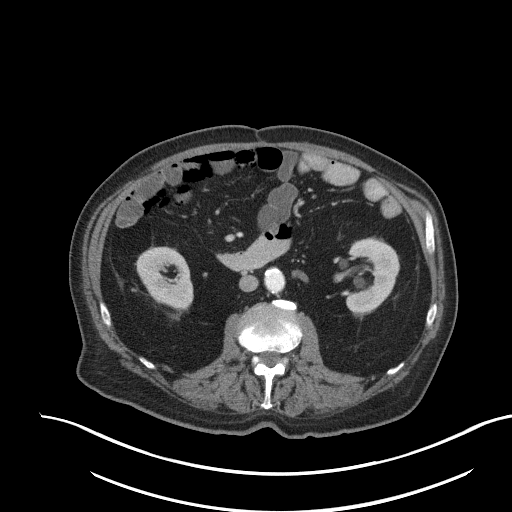
[im 155/240  soft-tissue]
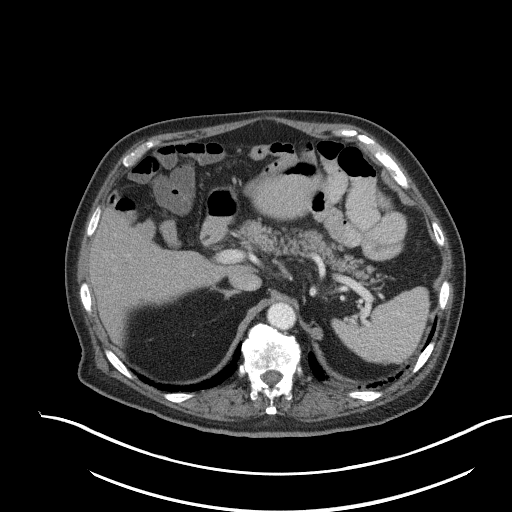
[im 169/240  soft-tissue]
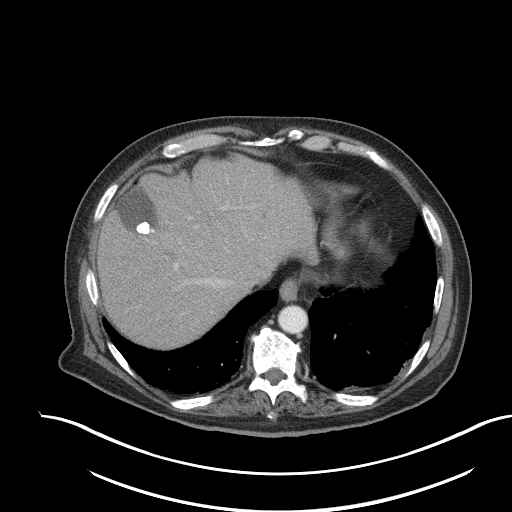
[im 169/240  bone]
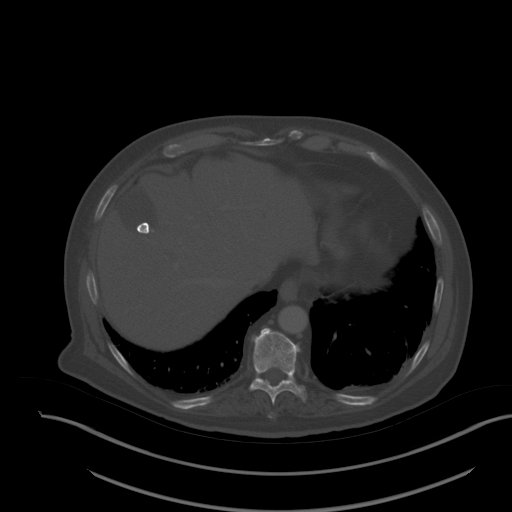
[im 183/240  soft-tissue]
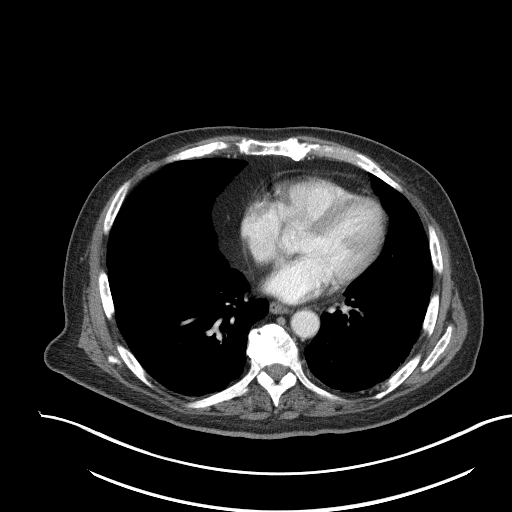
[im 183/240  lung]
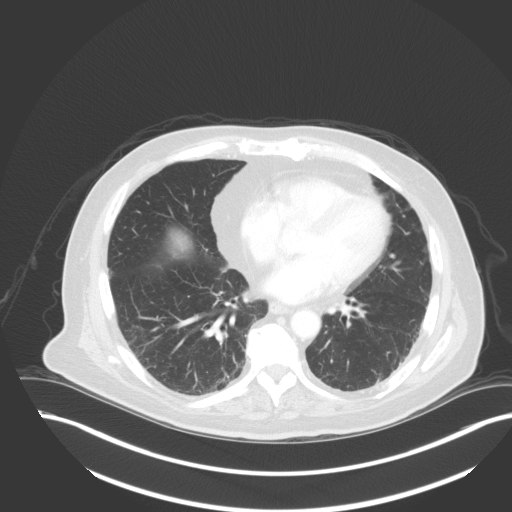
[im 197/240  lung]
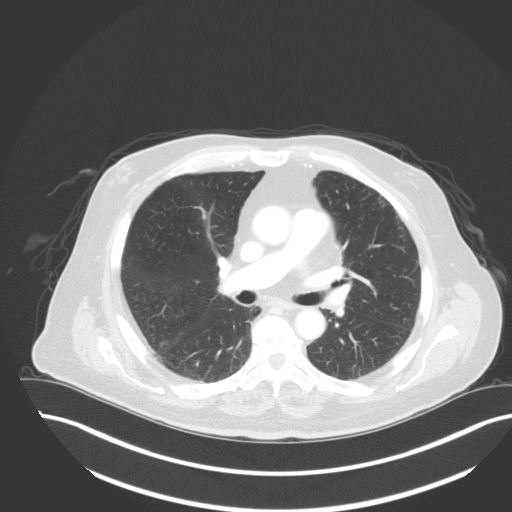
[im 211/240  soft-tissue]
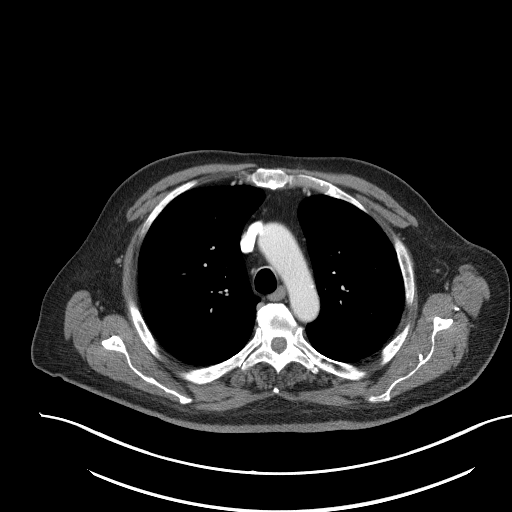
[im 211/240  lung]
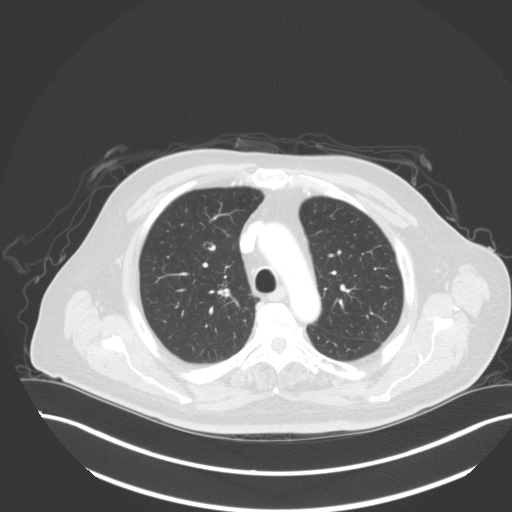
[im 225/240  soft-tissue]
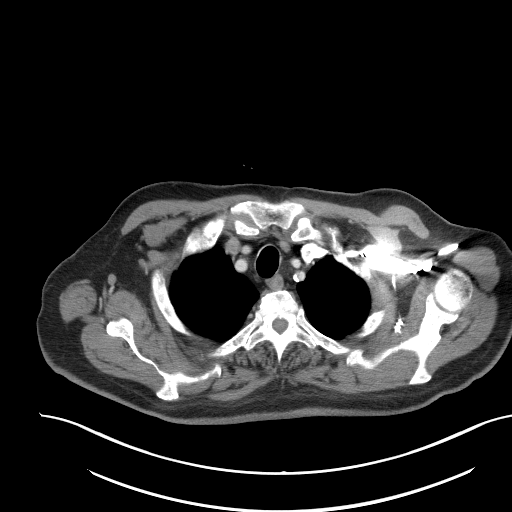
[im 225/240  lung]
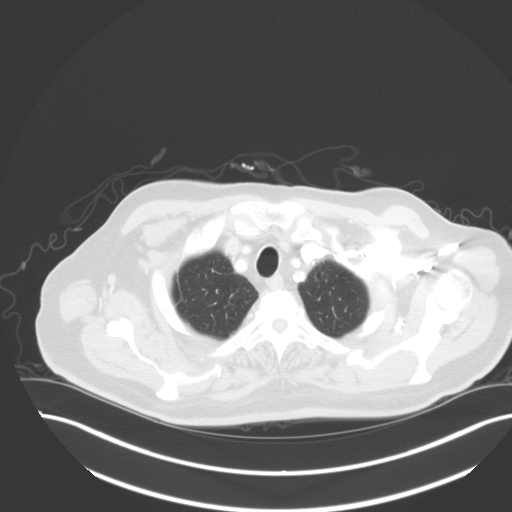

[13 of 32 positions shown; findings below may reference images not displayed]

FINDINGS: LUNGS AND PLEURA:  Minor stable scarring at the lung bases with lungs otherwise 
clear without mass or consolidations.. No pleural effusion. 
MEDIASTINUM:  No adenopathy. Normal heart size. No pericardial effusion. There 
is stenosis of the left lower lobe pulmonary artery at site of previous 
pulmonary embolus with no pulmonary embolus seen at this time. 
CHEST WALL/AXILLA: No mass or adenopathy. 
HEPATOBILIARY: No evidence for mass or biliary dilatation. There are gallstones 
in gallbladder without inflammatory changes or gallbladder wall thickening. 
SPLEEN: Normal in size. 
PANCREAS: No ductal dilatation or mass. 
ADRENALS: No mass. 
GENITOURINARY: No evidence for enhancing mass, stones or hydronephrosis. Stable 
bilateral renal cysts, including left parapelvic renal cysts. There is stable 
thickening of the posterior urinary bladder wall which is in proximity to the 
mass and measures 2.6 cm, but is unchanged when compared to previous CT. 
LYMPH NODES: Low-density lesion again seen adjacent to the left renal vein 
measuring 1.2 cm x 1.2 cm as seen on axial image number 103 series #2 and 
unchanged compared to previous CT. 
STOMACH, SMALL BOWEL AND COLON: Soft tissue mass in the pelvis adjacent to the 
postoperative bed and surgical clips again seen and measuring 2.6 cm x 3.9 cm 
and previously measured 2.6 cm x 3.9 cm which has not significantly changed and 
there remained stable adjacent adenopathy. Bowel loops within the abdomen pelvis 
demonstrate no inflammatory changes or obstruction. 
VASCULAR STRUCTURES: No aneurysm. 
MUSCULOSKELETAL: No acute osseous abnormality. Scattered degenerative changes. 
ADDITIONAL FINDINGS: None.
IMPRESSION: Relatively stable mass in the pelvis with in the region of the postoperative bed 
and adjacent to surgical clips with adjacent stable adenopathy. These findings 
may correlate with increasing CEA, and may consider follow-up with PET/CT if 
warranted. 
There is also stable thickening of the adjacent posterior aspect of the urinary 
bladder wall.. 
Mild stable scarring lungs with no lung mass seen. 
Stable stenosis of the left lower lobe pulmonary artery, likely related to 
previous pulmonary embolus. 
Cholelithiasis. 
Stable renal cysts. 
RADIATION DOSE REDUCTION: All CT scans are performed using radiation dose 
reduction techniques, when applicable.  Technical factors are evaluated and 
adjusted to ensure appropriate moderation of exposure.  Automated dose 
management technology is applied to adjust the radiation doses to minimize 
exposure while achieving diagnostic quality images.

## 2021-04-05 IMAGING — MR MRI ABDOMEN W/WO CONTRAST
20 series · 48 of 48 positions shown · IV contrast (gadavist)
Comparison: CT chest abdomen pelvis January 23, 2021, PET CT May 09, 2020

MRI ABDOMEN W/WO CONTRAST, 04/05/2021 [DATE]: 
CLINICAL INDICATION: Metastatic colon cancer, status post colon resection, 
appendectomy
TECHNIQUE: Multiplanar, multiecho position MR images of the abdomen were 
performed with and without intravenous enhancement.  10 cc's of Gadavist is 
injected intravenously by hand. The patient's eGFR was calculated to be 69 using 
the i-STAT device.

[Series 301: survey nav · axial · 15.0mm · 1.76mm/px · z∈[+50,+204]mm · 2 of 11 slices shown]
[im 1/11]
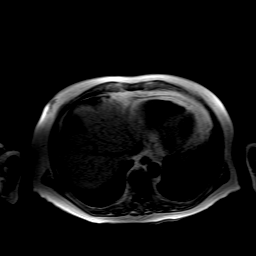
[im 11/11]
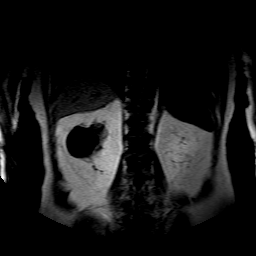

[Series 502: sout of phase · axial · 5.0mm · 0.95mm/px · z∈[-59,+97]mm · 2 of 27 slices shown]
[im 1/27]
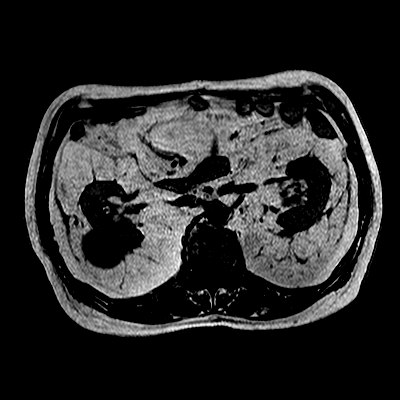
[im 27/27]
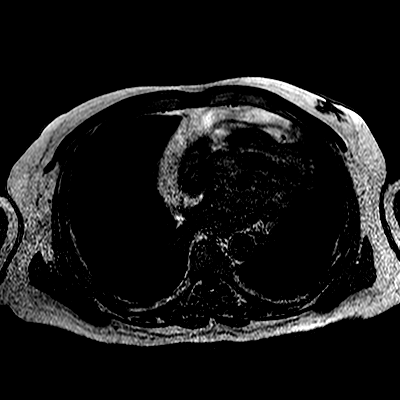

[Series 503: sin phase · axial · 5.0mm · 0.95mm/px · z∈[-59,+97]mm · 2 of 27 slices shown]
[im 1/27]
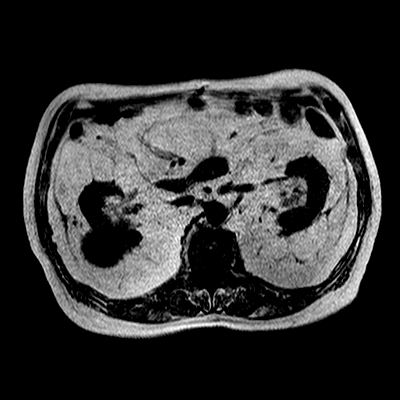
[im 27/27]
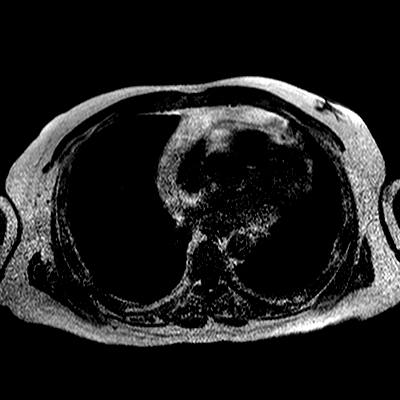

[Series 601: T2 · axial · 5.0mm · 0.95mm/px · 1 of 27 slices shown (1 of 2)]
[im 1/27]
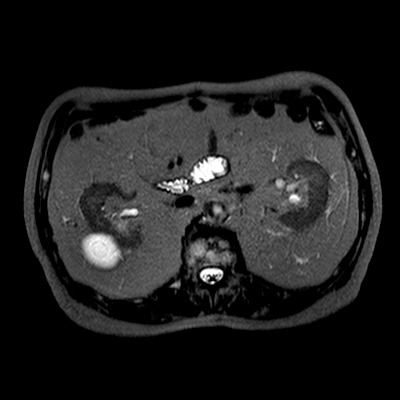

[Series 701: T2 fat-sat · axial · 5.0mm · 0.95mm/px · 1 of 30 slices shown]
[im 1/30]
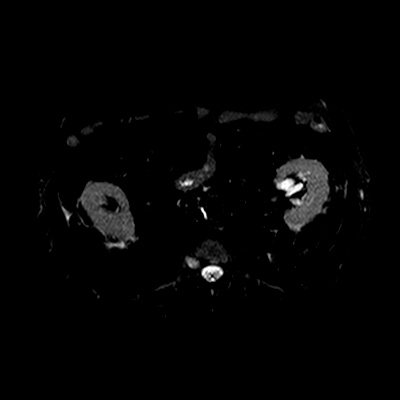

[Series 801: t1_tfe_ip_cor_nav · coronal · 5.0mm · 1.14mm/px · 2 of 32 slices shown]
[im 1/32]
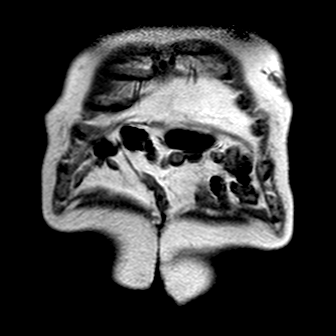
[im 32/32]
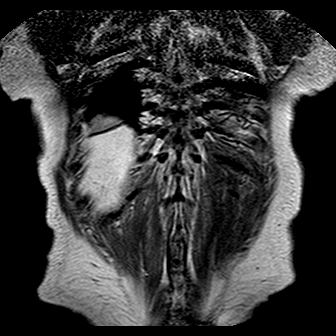

[Series 901: T2 · coronal · 5.0mm · 0.99mm/px · 2 of 32 slices shown (2 of 2)]
[im 1/32]
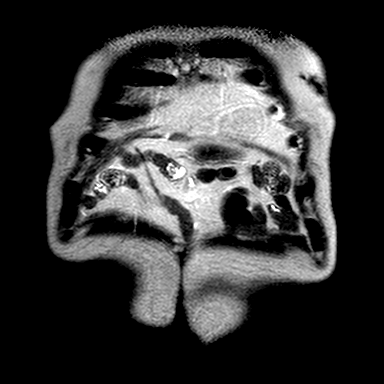
[im 32/32]
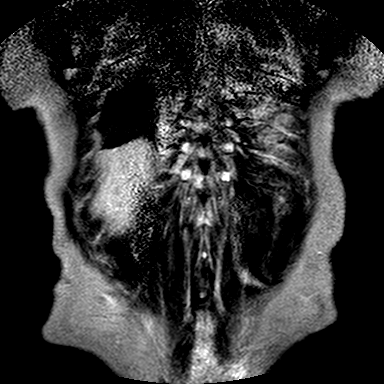

[Series 1004: sb0 · axial · 5.0mm · 1.70mm/px · z∈[-74,+112]mm · 2 of 32 slices shown]
[im 1/32]
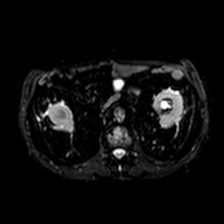
[im 32/32]
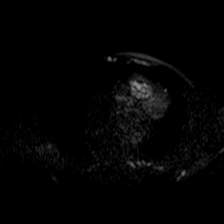

[Series 1006: (id) · axial · 5.0mm · 1.70mm/px · z∈[-74,+112]mm · 2 of 32 slices shown]
[im 1/32]
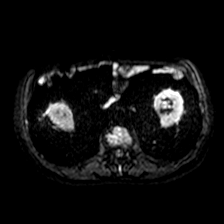
[im 32/32]
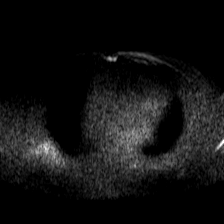

[Series 1102: T1 dynamic · axial · 5.0mm · 1.70mm/px · z∈[-78,+95]mm · 3 of 70 slices shown]
[im 1/70]
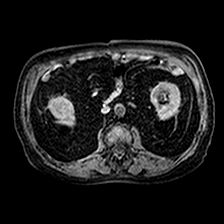
[im 35/70]
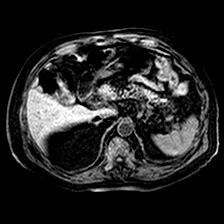
[im 70/70]
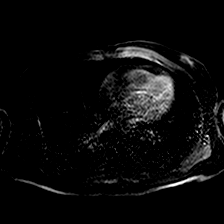

[Series 1103: sdyn. 1 · axial · 5.0mm · 1.70mm/px · z∈[-78,+95]mm · 3 of 70 slices shown]
[im 1/70]
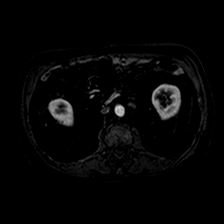
[im 35/70]
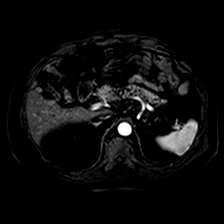
[im 70/70]
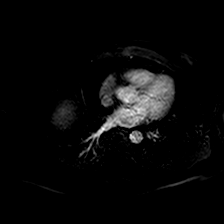

[Series 1104: sdyn. 2 · axial · 5.0mm · 1.70mm/px · z∈[-78,+95]mm · 3 of 70 slices shown]
[im 1/70]
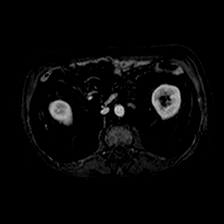
[im 35/70]
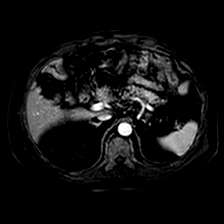
[im 70/70]
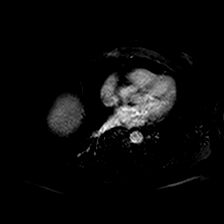

[Series 1106: sdyn. 4 · axial · 5.0mm · 1.70mm/px · z∈[-78,+95]mm · 3 of 70 slices shown]
[im 1/70]
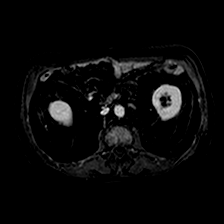
[im 35/70]
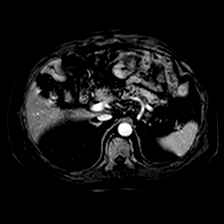
[im 70/70]
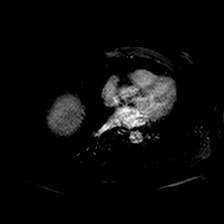

[Series 1107: sdyn. 5 · axial · 5.0mm · 1.70mm/px · z∈[-78,+95]mm · 3 of 70 slices shown]
[im 1/70]
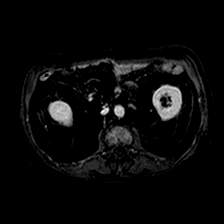
[im 35/70]
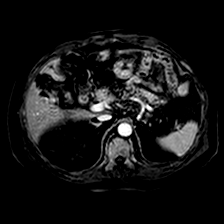
[im 70/70]
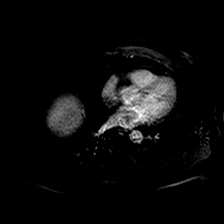

[Series 1108: ssub dyn 1* · axial · 5.0mm · 1.70mm/px · z∈[-78,+95]mm · 3 of 70 slices shown]
[im 1/70]
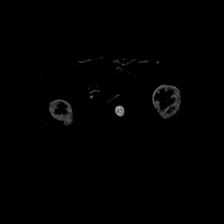
[im 35/70]
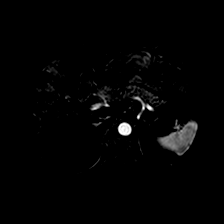
[im 70/70]
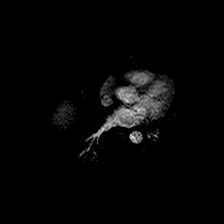

[Series 1109: ssub dyn 2 · axial · 5.0mm · 1.70mm/px · z∈[-78,+95]mm · 3 of 70 slices shown]
[im 1/70]
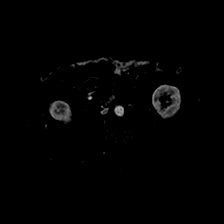
[im 35/70]
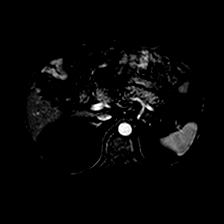
[im 70/70]
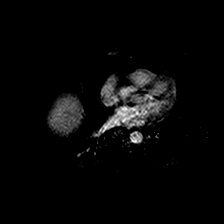

[Series 1111: ssub dyn 4 · axial · 5.0mm · 1.70mm/px · z∈[-78,+95]mm · 3 of 70 slices shown]
[im 1/70]
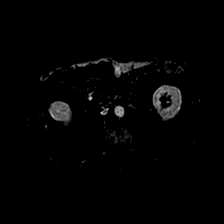
[im 35/70]
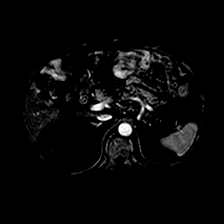
[im 70/70]
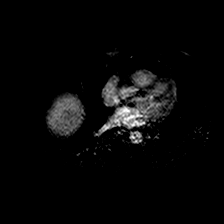

[Series 1112: ssub dyn 5 · axial · 5.0mm · 1.70mm/px · z∈[-78,+95]mm · 3 of 70 slices shown]
[im 1/70]
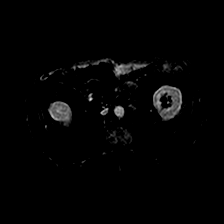
[im 35/70]
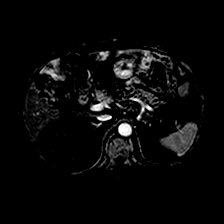
[im 70/70]
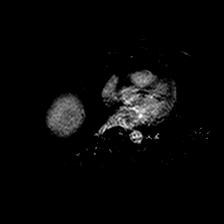

[Series 1301: t1_tfe_ip_cor_+gnav · coronal · 5.0mm · 1.14mm/px · 2 of 32 slices shown]
[im 1/32]
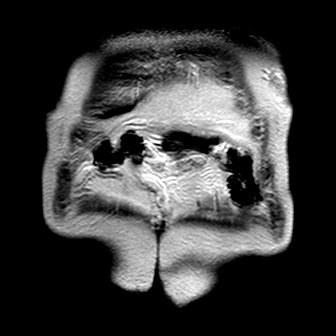
[im 32/32]
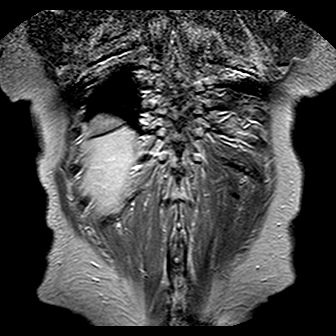

[Series 1401: T1 · axial · delayed · 5.0mm · 1.70mm/px · z∈[-78,+95]mm · 3 of 70 slices shown]
[im 1/70]
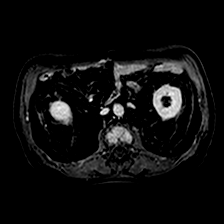
[im 35/70]
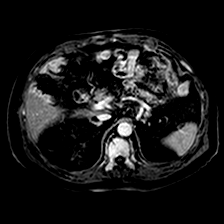
[im 70/70]
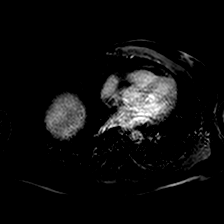

[48 of 48 positions shown; findings below may reference images not displayed]

FINDINGS: In phase out of phase images show no significant fatty infiltration. 
There are gallstones. Spleen is not enlarged. The pancreas and common bile duct 
are unremarkable. There are bilateral renal cysts without hydronephrosis. There 
is no retroperitoneal or intra-abdominal adenopathy. No ascites. No pleural 
effusion. 
Dynamic enhanced images show no pathologic visceral enhancement. There is 
symmetric excretion of contrast by the kidneys. Aorta shows normal caliber. 
Portal vein is open. 
The pelvis was not included on this examination.
IMPRESSION: No evidence for intra-abdominal metastasis or adenopathy. 
Gallstones. 
Benign renal cysts. 
The pelvis was not included on today's examination.

## 2021-05-12 IMAGING — MR MRI LUMBAR SPINE W/WO CONTRAST
5 of 11 series · 16 of 48 positions shown · IV contrast (gadavist)
Comparison: CT dated 12/14/2020

FINAL Diagnostic Imaging Report 
________________________________________________________________________________________________ 
MRI LUMBAR SPINE W/WO CONTRAST, 05/12/2021 [DATE]: 
CLINICAL INDICATION: Metastatic colon carcinoma with severe back pain
TECHNIQUE: Multiplanar, multiecho position MR images of the lumbar spine were 
performed without and with 7.5 mL of Gadavist injected intravenously. Patient 
was scanned on a 3T magnet.

[Series 201: survey · axial · 10.0mm · 1.39mm/px · z∈[-15,+199]mm · 3 of 9 slices shown]
[im 1/9]
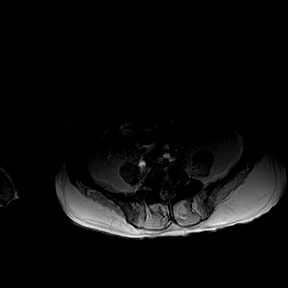
[im 5/9]
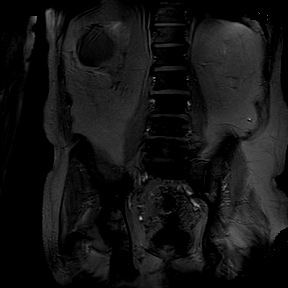
[im 9/9]
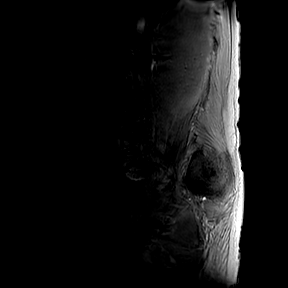

[Series 301: t2w_cor-surv · coronal · 6.0mm · 0.50mm/px · 1 of 5 slices shown]
[im 1/5]
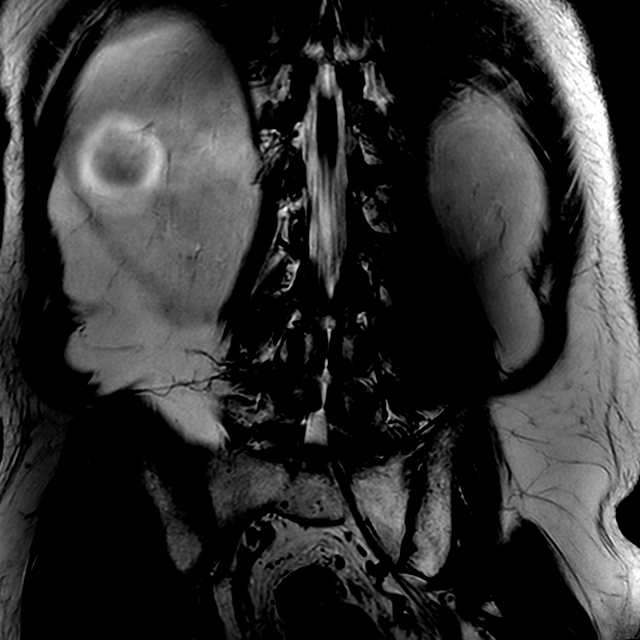

[Series 401: t2w_tse sag · sagittal · 4.0mm · 0.35mm/px · 3 of 17 slices shown]
[im 1/17]
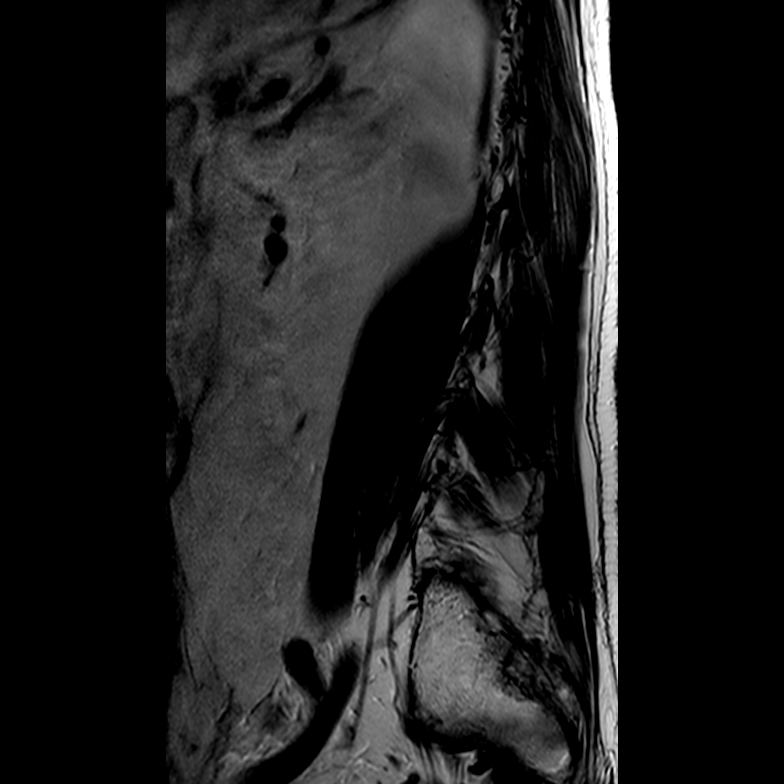
[im 9/17]
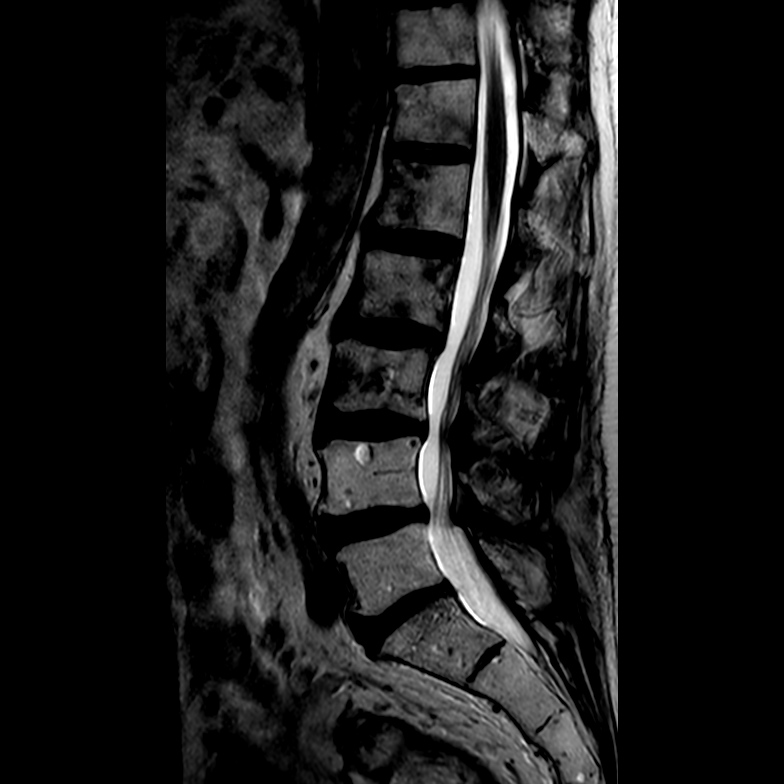
[im 17/17]
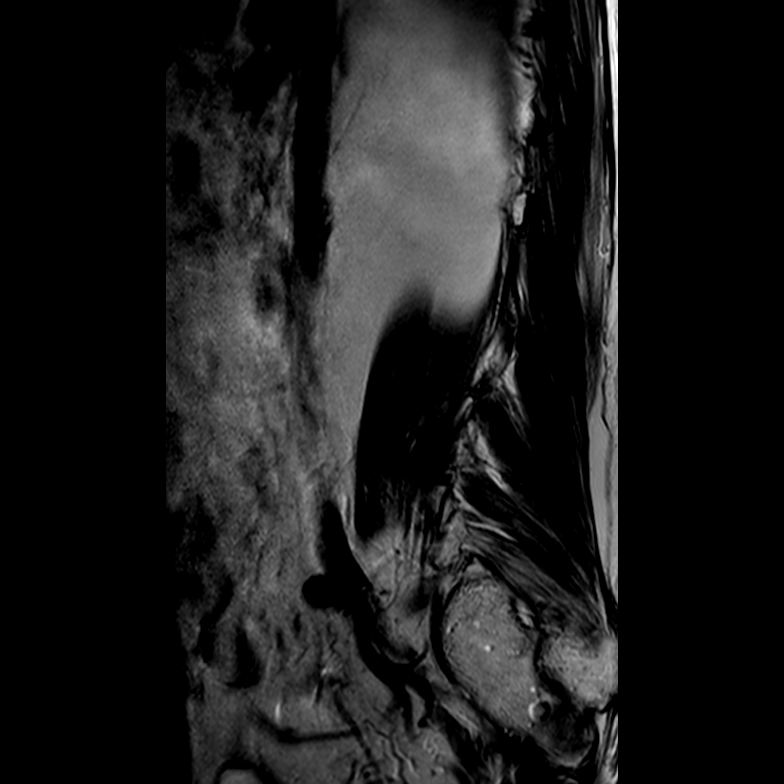

[Series 501: t1w_tse sag · sagittal · 4.0mm · 0.54mm/px · 2 of 17 slices shown]
[im 1/17]
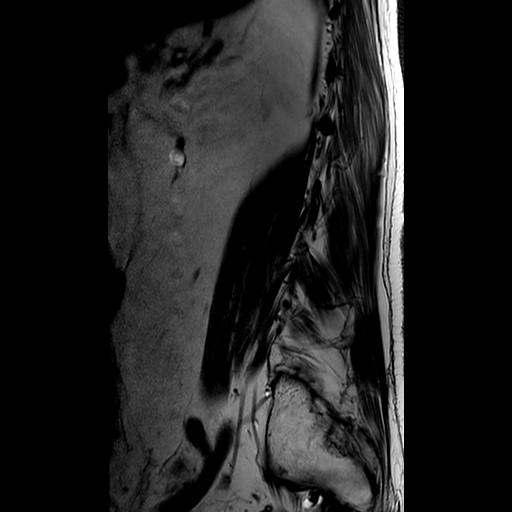
[im 9/17]
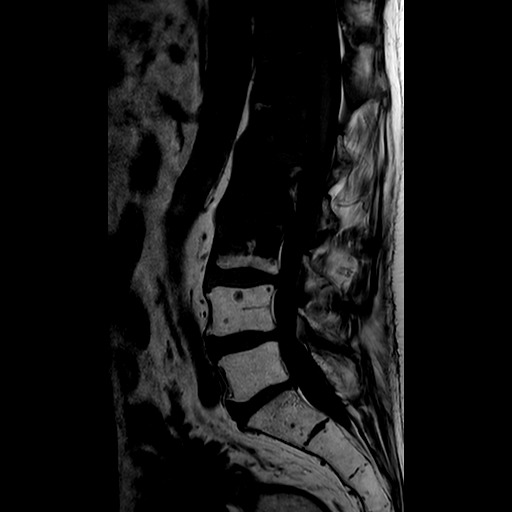

[Series 801: T1 · axial · 4.0mm · 0.35mm/px · z∈[-64,+130]mm · 7 of 35 slices shown]
[im 1/35]
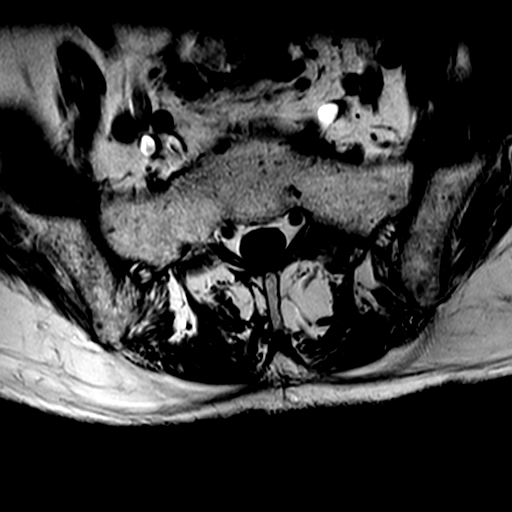
[im 6/35]
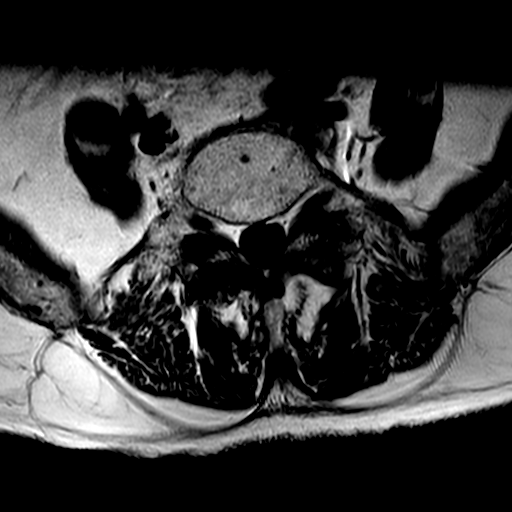
[im 12/35]
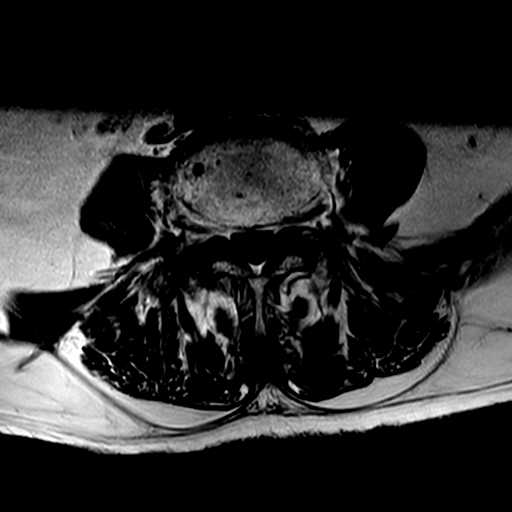
[im 18/35]
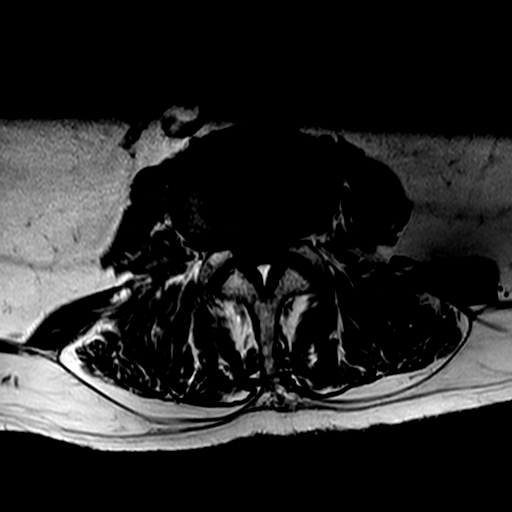
[im 23/35]
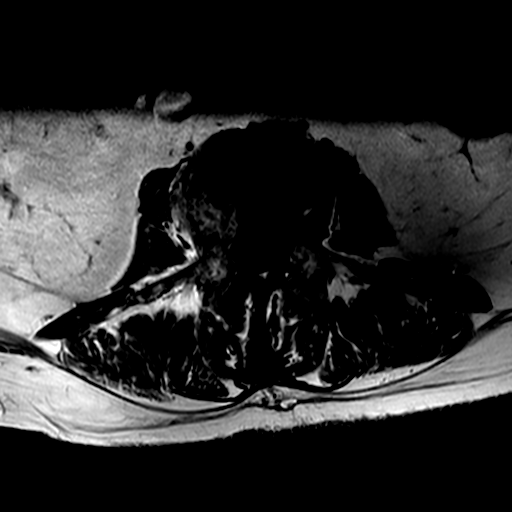
[im 29/35]
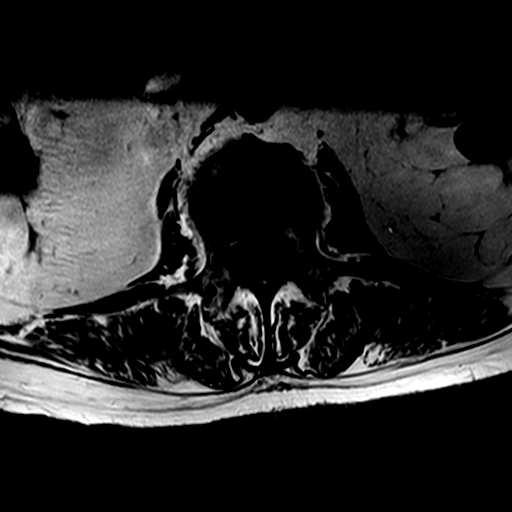
[im 35/35]
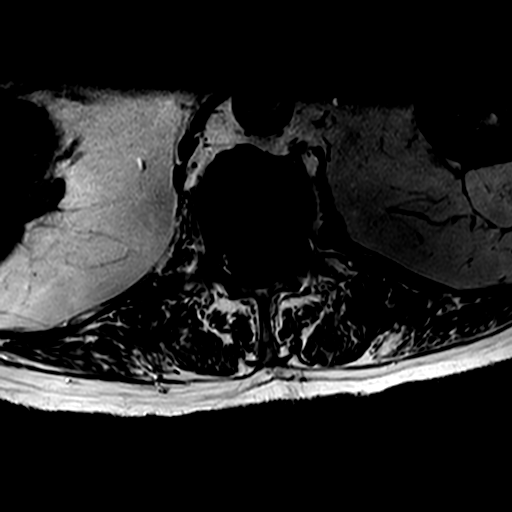

[16 of 48 positions shown; findings below may reference images not displayed]

FINDINGS: Nomenclature is based on 5 lumbar type vertebral bodies. The vertebral 
bodies are normal in height and alignment. No acute vertebral body fracture. No 
spondylolisthesis. No pars defect. Marked bone marrow replacement is observed in 
the T11, T12, L1, L2, and L3 vertebra including the vertebral bodies and 
posterior elements. The conus tip terminates at the L1 vertebral body level. The 
aorta is normal in diameter. The posterior paraspinal soft tissues are negative. 
No abnormal enhancement within the spinal cord or spinal canal. No abnormal disc 
enhancement. 
Modic I-II: None. 
Ligamentum Flavum > 2.5 mm: None. 
T12-L1: Bone marrow replacement consistent with metastatic disease is 
prominently exhibited. The disc is normal in height and signal. No disc 
herniation. Normal facets. No spinal canal or neural foraminal stenosis. 
L1-L2:Bone marrow replacement consistent with metastatic disease is prominently 
exhibited.  The disc is normal in height and signal. No disc herniation. Normal 
facets. No spinal canal or neural foraminal stenosis. 
L2-L3:Bone marrow replacement consistent with metastatic disease is prominently 
exhibited.  The disc is normal in height and signal. No disc herniation. Normal 
facets. No spinal canal or neural foraminal stenosis. 
L3-L4: The disc is normal in height and signal. No disc herniation. Normal 
facets. No spinal canal or neural foraminal stenosis. 
L4-L5: The disc is normal in height and signal. No disc herniation. Normal 
facets. No spinal canal or neural foraminal stenosis. 
L5-S1: The disc is normal in height and signal. No disc herniation. Normal 
facets. No spinal canal or neural foraminal stenosis.
IMPRESSION: Very prominent marrow replacement is observed in the lower thoracic and upper 
lumbar spine including T11, T12, L1, L2, and L3. The appearance is consistent 
with metastatic disease. No evidence of extension of the metastases to involve 
the spinal canal or neural foramina are observed. Tiny foci of abnormal marrow 
signal characteristics are noted scattered within the L4, L5, and visualized 
bones of the pelvis. These are likely small metastases.

## 2021-06-24 DEATH — deceased
